# Patient Record
Sex: Male | Born: 1965 | Race: Black or African American | Hispanic: No | Marital: Married | Smoking: Never smoker
Health system: Southern US, Community
[De-identification: ages and names within clinical notes are randomized; demographics above are authoritative.]

## PROBLEM LIST (undated history)

## (undated) DIAGNOSIS — N486 Induration penis plastica: Secondary | ICD-10-CM

## (undated) DIAGNOSIS — E119 Type 2 diabetes mellitus without complications: Secondary | ICD-10-CM

## (undated) DIAGNOSIS — E785 Hyperlipidemia, unspecified: Secondary | ICD-10-CM

## (undated) DIAGNOSIS — E781 Pure hyperglyceridemia: Secondary | ICD-10-CM

## (undated) HISTORY — DX: Type 2 diabetes mellitus without complications: E11.9

## (undated) HISTORY — DX: Pure hyperglyceridemia: E78.1

## (undated) HISTORY — PX: CIRCUMCISION: SUR203

## (undated) HISTORY — PX: COLONOSCOPY: SHX174

## (undated) HISTORY — DX: Induration penis plastica: N48.6

## (undated) HISTORY — PX: POLYPECTOMY: SHX149

## (undated) HISTORY — DX: Hyperlipidemia, unspecified: E78.5

---

## 2011-06-01 ENCOUNTER — Other Ambulatory Visit: Payer: Self-pay | Admitting: Physician Assistant

## 2011-06-01 ENCOUNTER — Ambulatory Visit
Admission: RE | Admit: 2011-06-01 | Discharge: 2011-06-01 | Disposition: A | Discharge status: Home / Self Care | Payer: BC Managed Care – PPO | Source: Ambulatory Visit | Attending: Physician Assistant | Admitting: Physician Assistant

## 2011-06-01 DIAGNOSIS — M545 Low back pain: Secondary | ICD-10-CM

## 2012-07-09 ENCOUNTER — Encounter: Payer: Self-pay | Admitting: Family Medicine

## 2012-07-09 ENCOUNTER — Ambulatory Visit (INDEPENDENT_AMBULATORY_CARE_PROVIDER_SITE_OTHER): Payer: BC Managed Care – PPO | Admitting: Family Medicine

## 2012-07-09 VITALS — BP 118/72 | HR 68 | Temp 97.5°F | Resp 16 | Ht 69.0 in | Wt 170.0 lb

## 2012-07-09 DIAGNOSIS — R1903 Right lower quadrant abdominal swelling, mass and lump: Secondary | ICD-10-CM

## 2012-07-09 DIAGNOSIS — E119 Type 2 diabetes mellitus without complications: Secondary | ICD-10-CM | POA: Insufficient documentation

## 2012-07-09 NOTE — Progress Notes (Signed)
  Subjective:    Patient ID: Marvin Spencer, male    DOB: May 03, 1965, 47 y.o.   MRN: [id number]  HPI Patient is here today for a knot in his abdomen. He states approximately a week ago he felt a knot in his right lower quadrant. It is not painful. He denies any nausea vomiting diarrhea or constipation. He denies any melena, hematochezia, or change in his bowel movements. He does actively work out. He does a lot of stomach exercises.  He denies any actual cardiac he can palpate although he is concerned he may have a hernia. He denies any hematuria or dysuria. Past Medical History  Diagnosis Date  . Diabetes mellitus without complication    Current outpatient prescriptions:aspirin 81 MG tablet, Take 81 mg by mouth daily., Disp: , Rfl: ;  metFORMIN (GLUCOPHAGE) 1000 MG tablet, Take 1,000 mg by mouth 2 (two) times daily with a meal., Disp: , Rfl: ;  sitaGLIPtin (JANUVIA) 50 MG tablet, Take 50 mg by mouth daily., Disp: , Rfl:  Not on File History   Social History  . Marital Status: Single    Spouse Name: N/A    Number of Children: N/A  . Years of Education: N/A   Occupational History  . Not on file.   Social History Main Topics  . Smoking status: Never Smoker   . Smokeless tobacco: Not on file  . Alcohol Use: Not on file  . Drug Use: Not on file  . Sexually Active: Not on file   Other Topics Concern  . Not on file   Social History Narrative  . No narrative on file       Review of Systems  All other systems reviewed and are negative.       Objective:   Physical Exam  Vitals reviewed. Constitutional: He appears well-developed and well-nourished.  Cardiovascular: Normal rate, regular rhythm and normal heart sounds.  Exam reveals no gallop.   No murmur heard. Pulmonary/Chest: Effort normal and breath sounds normal. No respiratory distress. He has no wheezes. He has no rales.  Abdominal: Soft. Bowel sounds are normal. He exhibits no distension and no mass. There is no  tenderness. There is no rebound and no guarding.          Assessment & Plan:  1. Abdominal mass, RLQ (right lower quadrant) I am not able to palpate any mass today. Furthermore there is no palpable hernia. I cannot find a hernia his inguinal canal. There is no testicular mass. There is no lymphadenopathy in the right inguinal canal. His abdominal exam is benign. I think the patient may have actually strained an internal oblique muscle. I feel that this may be what he is feeling. I have recommended a tincture of time of 2-3 weeks. If symptoms resolve no further workup is necessary. If symptoms persist and is obviously a hernia which develops I would recommend a Gen. surgery consult. If symptoms persist but there continues to be no hernia on exam, I recommend CT of the abdomen and pelvis.

## 2013-09-10 ENCOUNTER — Telehealth: Payer: Self-pay | Admitting: *Deleted

## 2013-09-10 NOTE — Telephone Encounter (Signed)
Called pt in reference to referral he was stating about and he is needing a [employer] referral to endocrinologist so that they can get paid Dr. Jeanann Lewandowsky for March office visit and July office visit.I am submitting referral today and wait on authorization.

## 2013-09-10 NOTE — Telephone Encounter (Signed)
Message copied by Maureen Chatters on Thu Sep 10, 2013 12:39 PM ------      Message from: Lenore Manner      Created: Wed Sep 09, 2013 12:52 PM      Regarding: Referral      Contact: [phone]       Pt is needing to speak to you about a referral  ------

## 2013-12-31 LAB — HM DIABETES EYE EXAM

## 2014-04-13 ENCOUNTER — Telehealth: Payer: Self-pay | Admitting: *Deleted

## 2014-04-13 NOTE — Telephone Encounter (Signed)
Pt called inquiring about a referral that was place years ago and stated that Dr. Jeanann Lewandowsky endocrinologist stated that his insurance did not pay and that when he came in for his appointment today that he needed to pay the whole amt. Pt stated that he needs a referral today to Dr.Preston Carlis Abbott, but hold up on it for now d/t he is going to call the Dr.office and see if he can come in for his appt today in hopes that [employer] will pay or cancel a few weeks out and try to get it authorized thru his insurance. I did not see where pt had this insurance and asked if when he called to give me that information so that we can update in his chart, states he will be by here later today and will give me a copy of his insurance so that we can update in record.

## 2014-04-16 ENCOUNTER — Telehealth: Payer: Self-pay | Admitting: *Deleted

## 2014-04-16 NOTE — Telephone Encounter (Signed)
Pt came in office and stated that [employer] needed a referral from [place] as his PCP to submit his referral to where he went to UC at [facility] [place]  I called the above UC and asked if I can get a Diagnosis code from his visit and the doctor name and NPI that had seen him. Spoke to [place] there who took my call and stated that they submit the insurnace themselves in order for [employer] to pay and that I do not need to submit it. She would not give me the dx code nor the npi and name of doctor.

## 2014-04-16 NOTE — Telephone Encounter (Signed)
Received call back from [place] at [facility] calling me back stating that the lady that was suppose to submit the referral to [employer] never did and gave me the dx code and npi number to their office and doctor, I will submit the referral to [employer] today. NPI number Group [id number] Dr. Rulon Abide ICD 10 code Z04.9 phone number [phone]

## 2014-04-21 ENCOUNTER — Telehealth: Payer: Self-pay | Admitting: *Deleted

## 2014-04-21 NOTE — Telephone Encounter (Signed)
Received fax from [employer] ([employer]) with authorization reference number [id number]   Provider speciality: Urgent care Center ( pt went to [place])  Dx: Z04.9-encounter for examination and observation for unspecified reason  Service dates:04/11/14   Visits:1

## 2014-05-21 ENCOUNTER — Encounter: Payer: Self-pay | Admitting: Family Medicine

## 2014-05-21 ENCOUNTER — Ambulatory Visit (INDEPENDENT_AMBULATORY_CARE_PROVIDER_SITE_OTHER): Admitting: Family Medicine

## 2014-05-21 VITALS — BP 108/68 | HR 78 | Temp 97.9°F | Resp 14 | Ht 68.0 in | Wt 174.0 lb

## 2014-05-21 DIAGNOSIS — M545 Low back pain, unspecified: Secondary | ICD-10-CM

## 2014-05-21 MED ORDER — DICLOFENAC SODIUM 75 MG PO TBEC: 75.0000 mg | DELAYED_RELEASE_TABLET | Freq: Two times a day (BID) | ORAL | Status: DC

## 2014-05-21 MED ORDER — CYCLOBENZAPRINE HCL 10 MG PO TABS: 10.0000 mg | ORAL_TABLET | Freq: Three times a day (TID) | ORAL | Status: DC | PRN

## 2014-05-21 NOTE — Progress Notes (Signed)
   Subjective:    Patient ID: Marvin Spencer, male    DOB: 1965-06-12, 49 y.o.   MRN: [id number]  HPI Patients back pain developed 5 days ago. It developed gradually after exercise. Pain is located in the lumbar paraspinal muscles. There is no radiation of the pain into his legs. He denies any numbness or weakness in the legs. He denies any symptoms of sciatica. He denies any symptoms of cauda equina syndrome. He denies any fevers or chills. He denies any hematuria or dysuria. He denies any injuries to the lower back. Past Medical History  Diagnosis Date  . Diabetes mellitus without complication    No past surgical history on file. Current Outpatient Prescriptions on File Prior to Visit  Medication Sig Dispense Refill  . metFORMIN (GLUCOPHAGE) 1000 MG tablet Take 1,000 mg by mouth 2 (two) times daily with a meal.     No current facility-administered medications on file prior to visit.   No Known Allergies History   Social History  . Marital Status: Single    Spouse Name: N/A  . Number of Children: N/A  . Years of Education: N/A   Occupational History  . Not on file.   Social History Main Topics  . Smoking status: Never Smoker   . Smokeless tobacco: Not on file  . Alcohol Use: Not on file  . Drug Use: Not on file  . Sexual Activity: Not on file   Other Topics Concern  . Not on file   Social History Narrative     Review of Systems  Musculoskeletal: Positive for back pain.  All other systems reviewed and are negative.      Objective:   Physical Exam  Constitutional: He is oriented to person, place, and time.  Cardiovascular: Normal rate, regular rhythm and normal heart sounds.   No murmur heard. Pulmonary/Chest: Effort normal and breath sounds normal. No respiratory distress. He has no wheezes. He has no rales.  Musculoskeletal:       Lumbar back: He exhibits decreased range of motion, tenderness and pain. He exhibits no bony tenderness and no spasm.    Neurological: He is alert and oriented to person, place, and time. He has normal reflexes. He displays normal reflexes. No cranial nerve deficit. He exhibits normal muscle tone. Coordination normal.  Vitals reviewed.         Assessment & Plan:  Midline low back pain without sciatica - Plan: diclofenac (VOLTAREN) 75 MG EC tablet, cyclobenzaprine (FLEXERIL) 10 MG tablet  Symptoms sound consistent with a muscle strain in the lumbar paraspinal muscles. I recommended diclofenac 75 mg by mouth twice a day and Flexeril 10 mg by mouth every 8 hours when necessary pain. Recheck next week if no better. At that time I will obtain x-rays of lower back if no better. Recheck sooner if worsening

## 2014-05-28 ENCOUNTER — Other Ambulatory Visit: Payer: Self-pay | Admitting: Family Medicine

## 2014-05-28 ENCOUNTER — Other Ambulatory Visit

## 2014-05-28 DIAGNOSIS — Z79899 Other long term (current) drug therapy: Secondary | ICD-10-CM

## 2014-05-28 DIAGNOSIS — Z0000 Encounter for general adult medical examination without abnormal findings: Secondary | ICD-10-CM

## 2014-05-28 DIAGNOSIS — E119 Type 2 diabetes mellitus without complications: Secondary | ICD-10-CM

## 2014-06-01 ENCOUNTER — Telehealth: Payer: Self-pay | Admitting: *Deleted

## 2014-06-01 NOTE — Telephone Encounter (Signed)
Submitted referral to [employer] for authorization for Endocrinology Dr. Jaci Standard Clark,MD with authorization number [id number]  Dx: E11.9-Type 2 diabetes Mellitus without complications  Service codes: [phone]  Service dates: 05/27/14- 2014/09/20   Visits:1  Service codes: [phone]  Service dates: 05/27/14-09-20-14  Visits:5

## 2014-06-07 ENCOUNTER — Encounter: Payer: Self-pay | Admitting: Family Medicine

## 2014-06-07 ENCOUNTER — Ambulatory Visit (INDEPENDENT_AMBULATORY_CARE_PROVIDER_SITE_OTHER): Admitting: Family Medicine

## 2014-06-07 VITALS — BP 104/64 | HR 80 | Temp 98.0°F | Resp 16 | Ht 68.0 in | Wt 174.0 lb

## 2014-06-07 DIAGNOSIS — E781 Pure hyperglyceridemia: Secondary | ICD-10-CM | POA: Insufficient documentation

## 2014-06-07 DIAGNOSIS — Z0000 Encounter for general adult medical examination without abnormal findings: Secondary | ICD-10-CM

## 2014-06-07 DIAGNOSIS — N486 Induration penis plastica: Secondary | ICD-10-CM | POA: Insufficient documentation

## 2014-06-07 DIAGNOSIS — M545 Low back pain, unspecified: Secondary | ICD-10-CM

## 2014-06-07 NOTE — Progress Notes (Signed)
Subjective:    Patient ID: Marvin Spencer, male    DOB: 08/15/1965, 49 y.o.   MRN: [id number]  HPI Patient is a very pleasant 49 year old African-American male who is here today for physical exam. He has with him lab work that was drawn at an outside physician's office. It was significant for fasting blood sugar of 312, a triglyceride level of 419, and an HDL level of 48. His CMP was normal. His CBC was normal. His TSH was normal. His PSA was normal. His C-peptide level was normal. Patient continues to complain of midline low back pain. Please see my last office visit. The medications help with the pain but the pain has not completely resolved. Past Medical History  Diagnosis Date  . Diabetes mellitus without complication   . Peyronie disease   . Hyperlipidemia   . Hypertriglyceridemia    No past surgical history on file. Current Outpatient Prescriptions on File Prior to Visit  Medication Sig Dispense Refill  . cyclobenzaprine (FLEXERIL) 10 MG tablet Take 1 tablet (10 mg total) by mouth 3 (three) times daily as needed for muscle spasms. 30 tablet 0  . diclofenac (VOLTAREN) 75 MG EC tablet Take 1 tablet (75 mg total) by mouth 2 (two) times daily. 30 tablet 0  . glimepiride (AMARYL) 4 MG tablet Take 4 mg by mouth 2 (two) times daily.  0  . metFORMIN (GLUCOPHAGE) 1000 MG tablet Take 1,000 mg by mouth 2 (two) times daily with a meal.     No current facility-administered medications on file prior to visit.   No Known Allergies History   Social History  . Marital Status: Single    Spouse Name: N/A  . Number of Children: N/A  . Years of Education: N/A   Occupational History  . Not on file.   Social History Main Topics  . Smoking status: Never Smoker   . Smokeless tobacco: Not on file  . Alcohol Use: Not on file  . Drug Use: Not on file  . Sexual Activity: Not on file   Other Topics Concern  . Not on file   Social History Narrative   Family History  Problem Relation Age of  Onset  . Diabetes Mellitus II    . Glaucoma        Review of Systems  All other systems reviewed and are negative.      Objective:   Physical Exam  Constitutional: He is oriented to person, place, and time. He appears well-developed and well-nourished. No distress.  HENT:  Head: Normocephalic and atraumatic.  Right Ear: External ear normal.  Left Ear: External ear normal.  Nose: Nose normal.  Mouth/Throat: Oropharynx is clear and moist. No oropharyngeal exudate.  Eyes: Conjunctivae and EOM are normal. Pupils are equal, round, and reactive to light. Right eye exhibits no discharge. Left eye exhibits no discharge. No scleral icterus.  Neck: Normal range of motion. Neck supple. No JVD present. No tracheal deviation present. No thyromegaly present.  Cardiovascular: Normal rate, regular rhythm, normal heart sounds and intact distal pulses.  Exam reveals no gallop and no friction rub.   No murmur heard. Pulmonary/Chest: Effort normal and breath sounds normal. No stridor. No respiratory distress. He has no wheezes. He has no rales. He exhibits no tenderness.  Abdominal: Soft. Bowel sounds are normal. He exhibits no distension and no mass. There is no tenderness. There is no rebound and no guarding.  Genitourinary: Rectum normal and prostate normal. Circumcised.  Musculoskeletal: Normal range  of motion. He exhibits no edema or tenderness.  Lymphadenopathy:    He has no cervical adenopathy.  Neurological: He is alert and oriented to person, place, and time. He has normal reflexes. He displays normal reflexes. No cranial nerve deficit. He exhibits normal muscle tone. Coordination normal.  Skin: Skin is warm. No rash noted. He is not diaphoretic. No erythema. No pallor.  Psychiatric: He has a normal mood and affect. His behavior is normal. Judgment and thought content normal.  Vitals reviewed.         Assessment & Plan:  Midline low back pain without sciatica - Plan: DG Lumbar Spine  Complete  Routine general medical examination at a health care facility  Patient's physical exam is normal. We had a long discussion today regarding his diabetes. At present his fasting blood sugars are well controlled. He is not checking his two-hour postprandial sugars. I would like him to call me with his two-hour postprandial sugars in one month. If still elevated, I would recommend starting the patient on Invokana. Also recommended an aspirin 81 mg by mouth daily. I would like to check a urine microalbumin as well as a fasting lipid panel in 3 months. Otherwise the remainder of his physical exam is completely normal. Patient's hemoglobin A1c was 10.7. Medications were changed his endocrinologist office. We will recheck his labs in 3 months. I would recommend a pneumonia vaccine when he turns 62. Diabetic eye exam is up-to-date. Diabetic foot exam is up-to-date.

## 2014-06-15 ENCOUNTER — Ambulatory Visit
Admission: RE | Admit: 2014-06-15 | Discharge: 2014-06-15 | Disposition: A | Discharge status: Home / Self Care | Source: Ambulatory Visit | Attending: Family Medicine | Admitting: Family Medicine

## 2014-06-15 DIAGNOSIS — M545 Low back pain, unspecified: Secondary | ICD-10-CM

## 2014-06-17 ENCOUNTER — Other Ambulatory Visit: Payer: Self-pay | Admitting: Family Medicine

## 2014-06-17 DIAGNOSIS — G8929 Other chronic pain: Secondary | ICD-10-CM

## 2014-06-17 DIAGNOSIS — M545 Low back pain, unspecified: Secondary | ICD-10-CM

## 2014-07-21 ENCOUNTER — Ambulatory Visit: Admitting: Physical Therapy

## 2015-06-24 ENCOUNTER — Encounter: Payer: Self-pay | Admitting: Family Medicine

## 2015-06-24 ENCOUNTER — Ambulatory Visit (INDEPENDENT_AMBULATORY_CARE_PROVIDER_SITE_OTHER): Admitting: Family Medicine

## 2015-06-24 VITALS — BP 120/84 | HR 86 | Temp 98.2°F | Resp 14 | Ht 68.0 in | Wt 167.0 lb

## 2015-06-24 DIAGNOSIS — S39012A Strain of muscle, fascia and tendon of lower back, initial encounter: Secondary | ICD-10-CM

## 2015-06-24 DIAGNOSIS — M545 Low back pain, unspecified: Secondary | ICD-10-CM

## 2015-06-24 MED ORDER — MELOXICAM 15 MG PO TABS: 15.0000 mg | ORAL_TABLET | Freq: Every day | ORAL | Status: DC

## 2015-06-24 MED ORDER — CYCLOBENZAPRINE HCL 10 MG PO TABS: 10.0000 mg | ORAL_TABLET | Freq: Three times a day (TID) | ORAL | Status: DC | PRN

## 2015-06-24 NOTE — Progress Notes (Signed)
Subjective:    Patient ID: Marvin Spencer, male    DOB: [birth year], 50 y.o.   MRN: [id number]  HPI Patient presentsWith 2 weeks of low back pain. The pain is located in the lumbar paraspinal muscles. There is no radiation of the pain. It hurts when he bends forward and picks up heavy objects. It hurts when he gets up out of bed or when he stands up from a seated position. 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 He denies any sciatica. He denies any cauda equina syndrome. He denies any weakness or numbness in his legs. He denies fever or dysuria Past Medical History  Diagnosis Date  . Diabetes mellitus without complication ([place])   . Peyronie disease   . Hyperlipidemia   . Hypertriglyceridemia    No past surgical history on file. Current Outpatient Prescriptions on File Prior to Visit  Medication Sig Dispense Refill  . glimepiride (AMARYL) 4 MG tablet Take 4 mg by mouth 2 (two) times daily.  0  . metFORMIN (GLUCOPHAGE) 1000 MG tablet Take 1,000 mg by mouth 2 (two) times daily with a meal.    . pravastatin (PRAVACHOL) 20 MG tablet Take 20 mg by mouth daily.  0  . diclofenac (VOLTAREN) 75 MG EC tablet Take 1 tablet (75 mg total) by mouth 2 (two) times daily. (Patient not taking: Reported on 06/24/2015) 30 tablet 0  . sitaGLIPtin (JANUVIA) 100 MG tablet Take 100 mg by mouth daily. Reported on 06/24/2015     No current facility-administered medications on file prior to visit.   No Known Allergies Social History   Social History  . Marital Status: Single    Spouse Name: N/A  . Number of Children: N/A  . Years of Education: N/A   Occupational History  . Not on file.   Social History Main Topics  . Smoking status: Never Smoker   . Smokeless tobacco: Not on file  . Alcohol Use: Not on file  . Drug Use: Not on file  . Sexual Activity: Not on file   Other Topics Concern  . Not on file   Social History Narrative      Review of Systems  All other  systems reviewed and are negative.      Objective:   Physical Exam  Constitutional: He appears well-developed and well-nourished.  Cardiovascular: Normal rate, regular rhythm and normal heart sounds.   No murmur heard. Pulmonary/Chest: Effort normal and breath sounds normal. No respiratory distress. He has no wheezes. He has no rales.  Musculoskeletal:       Lumbar back: He exhibits decreased range of motion, tenderness, pain and spasm. He exhibits no bony tenderness.  Vitals reviewed.  Patient has decreased range of motion with pain with forward flexion at 45.       Assessment & Plan:  Low back strain, initial encounter - Plan: DG Lumbar Spine Complete  Midline low back pain without sciatica - Plan: cyclobenzaprine (FLEXERIL) 10 MG tablet, meloxicam (MOBIC) 15 MG tablet  I believe the patient is likely strain to lumbar paraspinal muscle. I recommended that he go for an x-ray of his lumbar spine to rule out degenerative disc disease or any other orthopedic issue. If the x-ray is normal as expected, I will treat the patient is a low back strain with meloxicam 15 mg by mouth daily and Flexeril 10 mg every 8 hours as needed. I recommended no heavy lifting more than 10 pounds for  the next 2 weeks. I am fine with the patient pushing and pulling on packages at waist level but I do not want him bending over to pick up packages. Recheck in 2 weeks if no better or sooner if worse

## 2015-07-07 ENCOUNTER — Encounter: Payer: Self-pay | Admitting: Family Medicine

## 2015-07-07 ENCOUNTER — Telehealth: Payer: Self-pay | Admitting: Family Medicine

## 2015-07-07 NOTE — Telephone Encounter (Signed)
Pt is requesting a letter from Dr. Dennard Schaumann stating that he is physically unable to participate in his [employer] physical activities this weekend due to his back injury. [phone]

## 2015-07-08 NOTE — Telephone Encounter (Signed)
To MD

## 2015-07-08 NOTE — Telephone Encounter (Signed)
Note is on RX printed on my desk

## 2015-07-08 NOTE — Telephone Encounter (Signed)
Call placed to patient and patient made aware.   Prescription faxed to [phone] per patient request.

## 2015-12-09 ENCOUNTER — Ambulatory Visit (INDEPENDENT_AMBULATORY_CARE_PROVIDER_SITE_OTHER): Admitting: Family Medicine

## 2015-12-09 ENCOUNTER — Encounter: Payer: Self-pay | Admitting: Family Medicine

## 2015-12-09 VITALS — BP 142/86 | HR 78 | Temp 97.9°F | Resp 14 | Ht 68.0 in | Wt 169.0 lb

## 2015-12-09 DIAGNOSIS — E119 Type 2 diabetes mellitus without complications: Secondary | ICD-10-CM | POA: Diagnosis not present

## 2015-12-09 LAB — COMPLETE METABOLIC PANEL WITH GFR
ALT: 30 U/L (ref 9–46)
AST: 76 U/L — AB (ref 10–35)
Albumin: 4.2 g/dL (ref 3.6–5.1)
Alkaline Phosphatase: 67 U/L (ref 40–115)
BUN: 14 mg/dL (ref 7–25)
CALCIUM: 9 mg/dL (ref 8.6–10.3)
CHLORIDE: 101 mmol/L (ref 98–110)
CO2: 27 mmol/L (ref 20–31)
Creat: 1 mg/dL (ref 0.70–1.33)
GFR, EST NON AFRICAN AMERICAN: 87 mL/min (ref >=60)
GFR, Est African American: >89 mL/min (ref >=60)
Glucose, Bld: 213 mg/dL — ABNORMAL HIGH (ref 70–99)
POTASSIUM: 3.9 mmol/L (ref 3.5–5.3)
SODIUM: 138 mmol/L (ref 135–146)
Total Bilirubin: 0.5 mg/dL (ref 0.2–1.2)
Total Protein: 6.8 g/dL (ref 6.1–8.1)

## 2015-12-09 LAB — LIPID PANEL
CHOL/HDL RATIO: 4.2 ratio (ref <=5.0)
CHOLESTEROL: 168 mg/dL (ref 125–200)
HDL: 40 mg/dL (ref >=40)
LDL Cholesterol: 93 mg/dL (ref <130)
Triglycerides: 176 mg/dL — ABNORMAL HIGH (ref <150)
VLDL: 35 mg/dL — AB (ref <30)

## 2015-12-09 LAB — CBC WITH DIFFERENTIAL/PLATELET
BASOS PCT: 0 %
Basophils Absolute: 0 cells/uL (ref 0–200)
Eosinophils Absolute: 77 cells/uL (ref 15–500)
Eosinophils Relative: 1 %
HEMATOCRIT: 43.2 % (ref 38.5–50.0)
HEMOGLOBIN: 14.3 g/dL (ref 13.0–17.0)
LYMPHS ABS: 2233 {cells}/uL (ref 850–3900)
Lymphocytes Relative: 29 %
MCH: 28.4 pg (ref 27.0–33.0)
MCHC: 33.1 g/dL (ref 32.0–36.0)
MCV: 85.7 fL (ref 80.0–100.0)
MONO ABS: 693 {cells}/uL (ref 200–950)
MPV: 9.9 fL (ref 7.5–12.5)
Monocytes Relative: 9 %
NEUTROS ABS: 4697 {cells}/uL (ref 1500–7800)
NEUTROS PCT: 61 %
Platelets: 291 10*3/uL (ref 140–400)
RBC: 5.04 MIL/uL (ref 4.20–5.80)
RDW: 12.6 % (ref 11.0–15.0)
WBC: 7.7 10*3/uL (ref 3.8–10.8)

## 2015-12-09 LAB — HEMOGLOBIN A1C
Hgb A1c MFr Bld: 11.4 % — ABNORMAL HIGH (ref <5.7)
MEAN PLASMA GLUCOSE: 280 mg/dL

## 2015-12-09 MED ORDER — GLIMEPIRIDE 4 MG PO TABS: 4.0000 mg | ORAL_TABLET | Freq: Two times a day (BID) | ORAL | 5 refills | Status: DC

## 2015-12-09 MED ORDER — METFORMIN HCL 1000 MG PO TABS: 1000.0000 mg | ORAL_TABLET | Freq: Two times a day (BID) | ORAL | 5 refills | Status: DC

## 2015-12-09 NOTE — Progress Notes (Signed)
   Subjective:    Patient ID: Marcello Moores, male    DOB: [birth year], 50 y.o.   MRN: [id number]  HPI Patient has previously been seeing an endocrinologist. However, he has not seen an endocrinologist in quite some time. He is currently on glimepiride and metformin. He is not checking his sugars but he reports polyuria, polydipsia, and blurred vision. He has been off Januvia and pravastatin for approximately 2 months. He is taking an aspirin. His blood pressure is slightly elevated. He is due for hemoglobin A1c, fasting lab work, and a urine microalbumin. He states that his fasting blood sugars in the morning of the last 2 days a been 200. Past Medical History:  Diagnosis Date  . Diabetes mellitus without complication ([place])   . Hyperlipidemia   . Hypertriglyceridemia   . Peyronie disease    No past surgical history on file. Current Outpatient Prescriptions on File Prior to Visit  Medication Sig Dispense Refill  . pravastatin (PRAVACHOL) 20 MG tablet Take 20 mg by mouth daily.  0  . sitaGLIPtin (JANUVIA) 100 MG tablet Take 100 mg by mouth daily. Reported on 06/24/2015     No current facility-administered medications on file prior to visit.    No Known Allergies Social History   Social History  . Marital status: Single    Spouse name: N/A  . Number of children: N/A  . Years of education: N/A   Occupational History  . Not on file.   Social History Main Topics  . Smoking status: Never Smoker  . Smokeless tobacco: Not on file  . Alcohol use Not on file  . Drug use: Unknown  . Sexual activity: Not on file   Other Topics Concern  . Not on file   Social History Narrative  . No narrative on file      Review of Systems  All other systems reviewed and are negative.      Objective:   Physical Exam  Neck: Neck supple. No JVD present.  Cardiovascular: Normal rate, regular rhythm and normal heart sounds.   No murmur heard. Pulmonary/Chest: Effort normal and breath sounds  normal. No respiratory distress. He has no wheezes. He has no rales.  Abdominal: Soft. Bowel sounds are normal.  Musculoskeletal: He exhibits no edema.  Vitals reviewed.         Assessment & Plan:  Diabetes mellitus type II, non insulin dependent ([facility]) - Plan: Lipid panel, COMPLETE METABOLIC PANEL WITH GFR, CBC with Differential/Platelet, Hemoglobin A1c, Microalbumin, urine  Blood pressure is elevated. I will check a urine microalbumin along with renal function. We will likely start the patient on losartan 50 mg by mouth daily depending upon lab work. I will also check a hemoglobin A1c and depending upon the level, I believe I will likely discontinue the Januvia he was previously taking and replace it with [facility] 5/25 poqday.  Check fasting lipid panel. Goal LDL cholesterol is less than 100. The patient will likely need to resume pravastatin

## 2015-12-10 LAB — MICROALBUMIN, URINE: Microalb, Ur: 2.8 mg/dL

## 2015-12-14 ENCOUNTER — Other Ambulatory Visit: Payer: Self-pay | Admitting: Family Medicine

## 2015-12-14 MED ORDER — EMPAGLIFLOZIN-LINAGLIPTIN 25-5 MG PO TABS: 1.0000 | ORAL_TABLET | Freq: Every day | ORAL | 3 refills | Status: DC

## 2015-12-16 LAB — HM DIABETES EYE EXAM

## 2015-12-22 ENCOUNTER — Encounter: Payer: Self-pay | Admitting: Family Medicine

## 2016-04-04 IMAGING — CR DG LUMBAR SPINE COMPLETE 4+V
5 series · 5 of 5 positions shown · non-contrast
Comparison: None.

CLINICAL DATA: Chronic low back pain

EXAM:
LUMBAR SPINE - COMPLETE 4+ VIEW

[w lumbar spine ap]
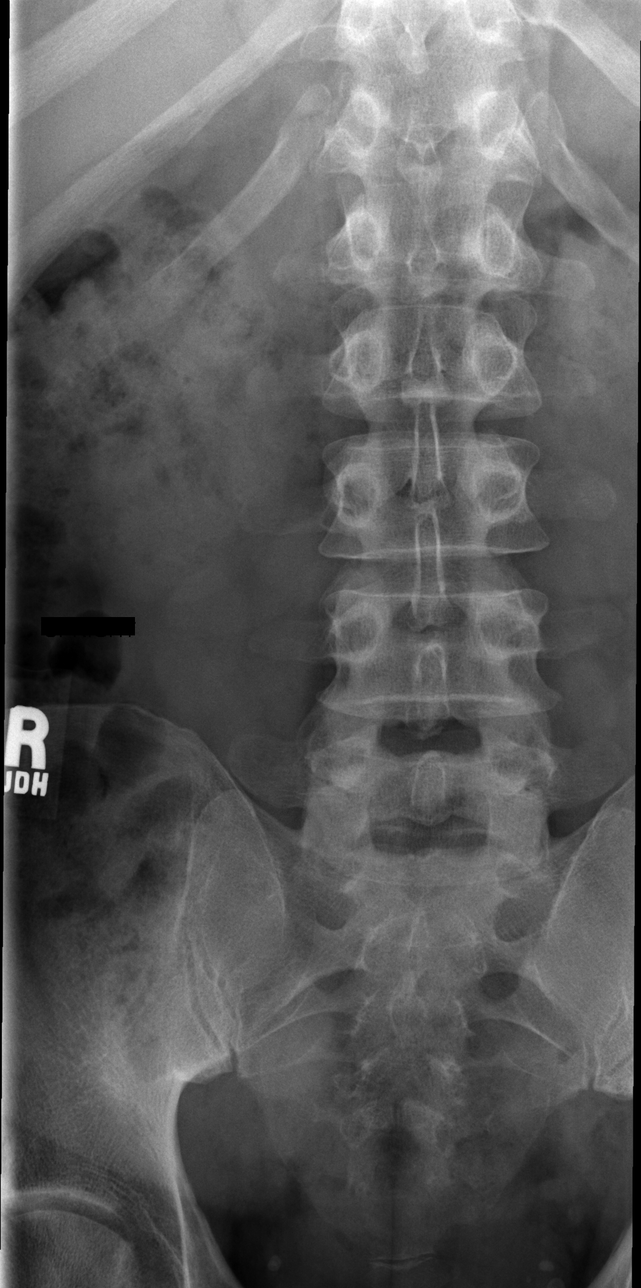

[w lumbar spine obl (1 of 2)]
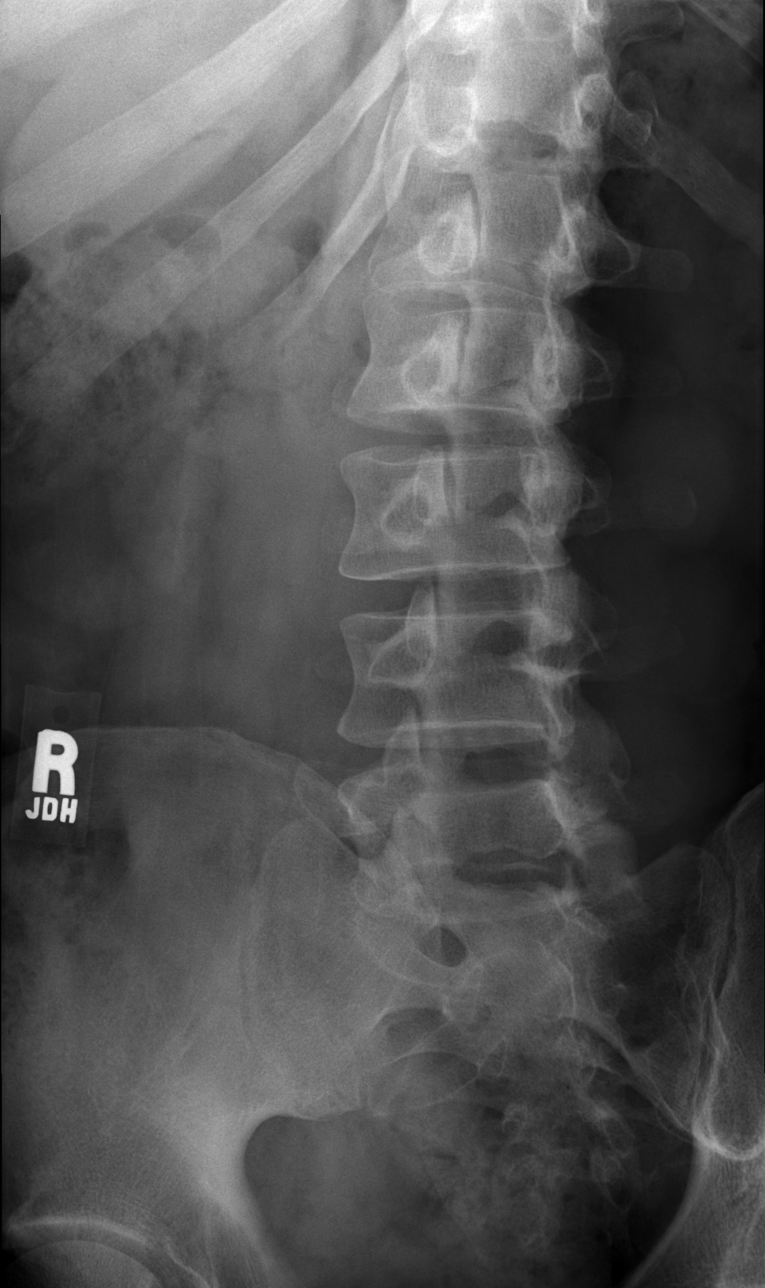

[w lumbar spine obl (2 of 2)]
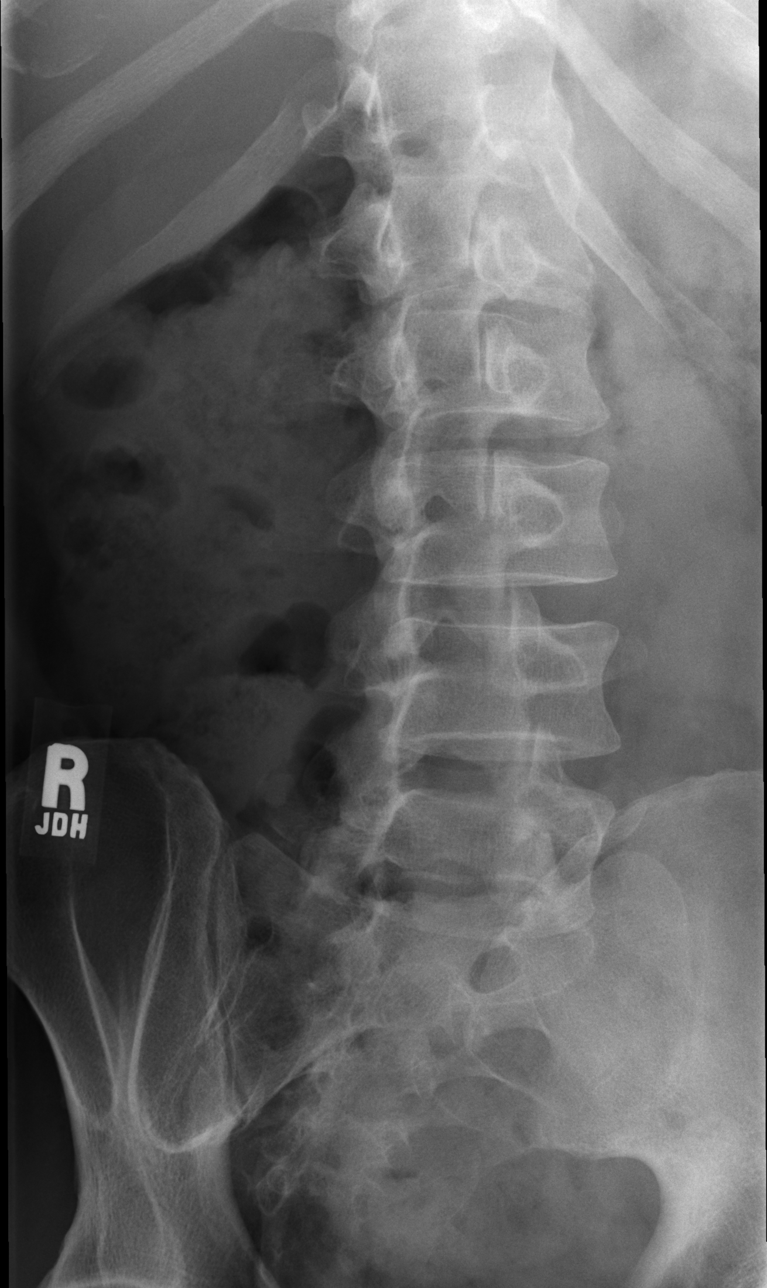

[w lumbar spine lat]
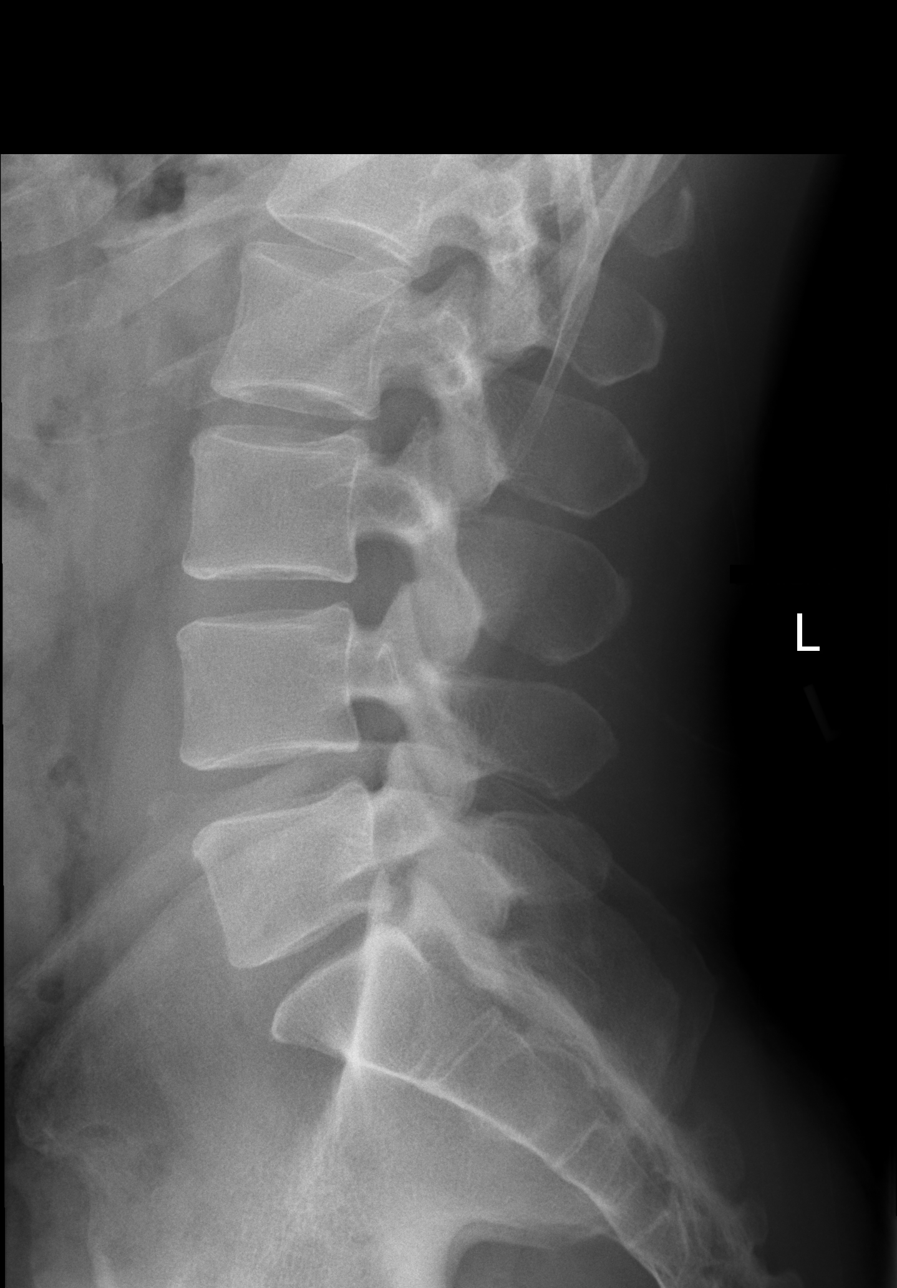

[w lumbar l-5 s-1 spot]
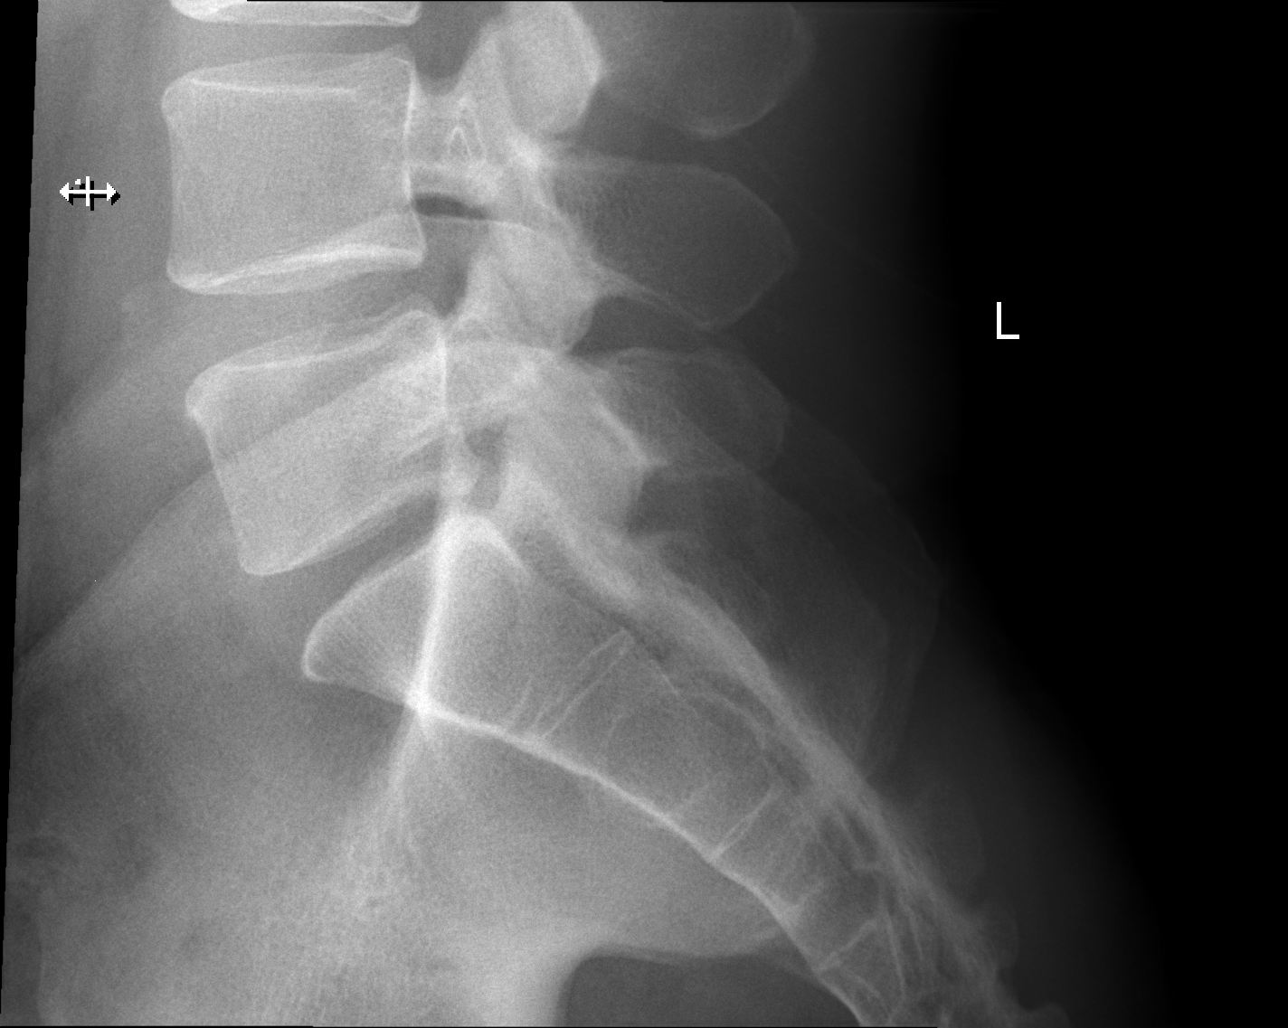

[5 of 5 positions shown; findings below may reference images not displayed]

FINDINGS: There is no evidence of lumbar spine fracture. Alignment is normal.
Intervertebral disc spaces are maintained.
IMPRESSION: Negative.

## 2016-05-30 ENCOUNTER — Encounter: Payer: Self-pay | Admitting: Family Medicine

## 2016-08-27 ENCOUNTER — Encounter: Payer: Self-pay | Admitting: Family Medicine

## 2016-09-14 ENCOUNTER — Other Ambulatory Visit: Payer: Self-pay | Admitting: Family Medicine

## 2016-09-14 MED ORDER — METFORMIN HCL 1000 MG PO TABS: 1000.0000 mg | ORAL_TABLET | Freq: Two times a day (BID) | ORAL | 0 refills | Status: DC

## 2016-09-14 MED ORDER — GLIMEPIRIDE 4 MG PO TABS: 4.0000 mg | ORAL_TABLET | Freq: Two times a day (BID) | ORAL | 0 refills | Status: DC

## 2016-09-14 NOTE — Telephone Encounter (Signed)
Refilled Glimepiride and Metformin for 30 days - pt needs ov and labs before further refill. Pharm aware.

## 2016-09-17 ENCOUNTER — Telehealth: Payer: Self-pay | Admitting: Family Medicine

## 2016-09-17 NOTE — Telephone Encounter (Signed)
Pt called several times [facility] needing a refill on Metformin and Amaryl. [facility] called this morning and said they were going to contact [facility] to get the RX from them. Per [facility] ok to do a 30 day supply but he either needs to be seen here or in [place] that this will be the last refill he will receive until seen. Pt aware of this via vm.

## 2016-09-17 NOTE — Telephone Encounter (Signed)
Clearing encounter

## 2016-09-18 MED ORDER — METFORMIN HCL 1000 MG PO TABS: 1000.0000 mg | ORAL_TABLET | Freq: Two times a day (BID) | ORAL | 0 refills | Status: DC

## 2016-09-18 MED ORDER — GLIMEPIRIDE 4 MG PO TABS: 4.0000 mg | ORAL_TABLET | Freq: Two times a day (BID) | ORAL | 0 refills | Status: DC

## 2016-09-18 NOTE — Telephone Encounter (Signed)
Pt called back [facility] and states that he will be in [place] for 21 days and then will return here and at that time will make appt. Med sent to pharm requested.

## 2016-11-27 ENCOUNTER — Encounter: Payer: Self-pay | Admitting: Family Medicine

## 2016-11-27 ENCOUNTER — Ambulatory Visit (INDEPENDENT_AMBULATORY_CARE_PROVIDER_SITE_OTHER): Admitting: Family Medicine

## 2016-11-27 VITALS — BP 124/78 | HR 79 | Temp 97.8°F | Resp 16 | Ht 68.0 in | Wt 156.0 lb

## 2016-11-27 DIAGNOSIS — E782 Mixed hyperlipidemia: Secondary | ICD-10-CM | POA: Diagnosis not present

## 2016-11-27 DIAGNOSIS — E781 Pure hyperglyceridemia: Secondary | ICD-10-CM

## 2016-11-27 DIAGNOSIS — R634 Abnormal weight loss: Secondary | ICD-10-CM

## 2016-11-27 DIAGNOSIS — Z125 Encounter for screening for malignant neoplasm of prostate: Secondary | ICD-10-CM

## 2016-11-27 DIAGNOSIS — E119 Type 2 diabetes mellitus without complications: Secondary | ICD-10-CM

## 2016-11-27 DIAGNOSIS — Z23 Encounter for immunization: Secondary | ICD-10-CM | POA: Diagnosis not present

## 2016-11-27 NOTE — Progress Notes (Signed)
Subjective:    Patient ID: Marvin Spencer, male    DOB: June 10, 1965, 51 y.o.   MRN: [id number]  HPI  11/2015 Patient has previously been seeing an endocrinologist. However, he has not seen an endocrinologist in quite some time. He is currently on glimepiride and metformin. He is not checking his sugars but he reports polyuria, polydipsia, and blurred vision. He has been off Januvia and pravastatin for approximately 2 months. He is taking an aspirin. His blood pressure is slightly elevated. He is due for hemoglobin A1c, fasting lab work, and a urine microalbumin. He states that his fasting blood sugars in the morning of the last 2 days a been 200.  At that time, my plan was: Blood pressure is elevated. I will check a urine microalbumin along with renal function. We will likely start the patient on losartan 50 mg by mouth daily depending upon lab work. I will also check a hemoglobin A1c and depending upon the level, I believe I will likely discontinue the Januvia he was previously taking and replace it with [facility] 5/25 poqday.  Check fasting lipid panel. Goal LDL cholesterol is less than 100. The patient will likely need to resume pravastatin  11/27/2016 Patient has not been seen in 1 year or had lab work.  He did have a diabetic eye exam in 12/2015.  He reports that he is not checking his blood sugar.  When he does, his blood sugar is greater then 300. He does report polyuria, polydipsia area and he denies blurry vision. He does demonstrate weight loss since his last visit despite eating a regular diet. His complexion is paler than before and he does look dehydrated.  He denies any chest pain shortness of breath dyspnea on exertion. He has not taken any medication in more than a month. Prior to that he was only on Amaryl and metformin. He is due for prostate cancer screening as well as a colonoscopy but he defers a colonoscopy at the present time  Past Medical History:  Diagnosis Date  . Diabetes  mellitus without complication ([place])   . Hyperlipidemia   . Hypertriglyceridemia   . Peyronie disease    No past surgical history on file. Current Outpatient Prescriptions on File Prior to Visit  Medication Sig Dispense Refill  . Empagliflozin-Linagliptin (GLYXAMBI) 25-5 MG TABS Take 1 tablet by mouth daily. 30 tablet 3  . glimepiride (AMARYL) 4 MG tablet Take 1 tablet (4 mg total) by mouth 2 (two) times daily. 60 tablet 0  . metFORMIN (GLUCOPHAGE) 1000 MG tablet Take 1 tablet (1,000 mg total) by mouth 2 (two) times daily with a meal. 60 tablet 0  . pravastatin (PRAVACHOL) 20 MG tablet Take 20 mg by mouth daily.  0   No current facility-administered medications on file prior to visit.    No Known Allergies Social History   Social History  . Marital status: Single    Spouse name: N/A  . Number of children: N/A  . Years of education: N/A   Occupational History  . Not on file.   Social History Main Topics  . Smoking status: Never Smoker  . Smokeless tobacco: Not on file  . Alcohol use Not on file  . Drug use: Unknown  . Sexual activity: Not on file   Other Topics Concern  . Not on file   Social History Narrative  . No narrative on file      Review of Systems  All other systems reviewed and are  negative.      Objective:   Physical Exam  Neck: Neck supple. No JVD present.  Cardiovascular: Normal rate, regular rhythm and normal heart sounds.   No murmur heard. Pulmonary/Chest: Effort normal and breath sounds normal. No respiratory distress. He has no wheezes. He has no rales.  Abdominal: Soft. Bowel sounds are normal.  Musculoskeletal: He exhibits no edema.  Vitals reviewed.         Assessment & Plan:  Diabetes mellitus type II, non insulin dependent ([place]) - Plan: CBC with Differential/Platelet, COMPLETE METABOLIC PANEL WITH GFR, Lipid panel, Microalbumin, urine, Hemoglobin A1c  Hypertriglyceridemia - Plan: COMPLETE METABOLIC PANEL WITH GFR, Lipid  panel  Mixed hyperlipidemia - Plan: COMPLETE METABOLIC PANEL WITH GFR, Lipid panel  Blood pressure is acceptable today. However I anticipate his hemoglobin A1c will be greater than 13. If greater than 13, I will start the patient on insulin rather than add oral medication. I suspect that he will require Lantus. Await the results of lab work. I will also check a fasting lipid panel. Goal LDL cholesterol is less than 100. Patient received his pneumonia vaccine today. He is already received his flu shot. Diabetic eye exam is up-to-date and diabetic foot exam was performed today and is normal. Given his weight loss, I will screen the patient for prostate cancer with a PSA and I will also check a TSH that is due secondary to his diabetes. He declines a colonoscopy at the present time

## 2016-11-28 LAB — MICROALBUMIN, URINE: MICROALB UR: 6.3 mg/dL

## 2016-11-28 LAB — COMPLETE METABOLIC PANEL WITH GFR
AG Ratio: 1.4 (calc) (ref 1.0–2.5)
ALBUMIN MSPROF: 4 g/dL (ref 3.6–5.1)
ALKALINE PHOSPHATASE (APISO): 100 U/L (ref 40–115)
ALT: 26 U/L (ref 9–46)
AST: 27 U/L (ref 10–35)
BUN: 13 mg/dL (ref 7–25)
CALCIUM: 9.2 mg/dL (ref 8.6–10.3)
CO2: 26 mmol/L (ref 20–32)
CREATININE: 0.93 mg/dL (ref 0.70–1.33)
Chloride: 98 mmol/L (ref 98–110)
GFR, EST NON AFRICAN AMERICAN: 95 mL/min/{1.73_m2} (ref >=60)
GFR, Est African American: 110 mL/min/{1.73_m2} (ref >=60)
GLOBULIN: 2.8 g/dL (ref 1.9–3.7)
GLUCOSE: 348 mg/dL — AB (ref 65–99)
Potassium: 3.9 mmol/L (ref 3.5–5.3)
SODIUM: 135 mmol/L (ref 135–146)
TOTAL PROTEIN: 6.8 g/dL (ref 6.1–8.1)
Total Bilirubin: 0.6 mg/dL (ref 0.2–1.2)

## 2016-11-28 LAB — LIPID PANEL
CHOLESTEROL: 293 mg/dL — AB (ref <200)
HDL: 47 mg/dL (ref >40)
Non-HDL Cholesterol (Calc): 246 mg/dL (calc) — ABNORMAL HIGH (ref <130)
Total CHOL/HDL Ratio: 6.2 (calc) — ABNORMAL HIGH (ref <5.0)
Triglycerides: 850 mg/dL — ABNORMAL HIGH (ref <150)

## 2016-11-28 LAB — CBC WITH DIFFERENTIAL/PLATELET
BASOS ABS: 32 {cells}/uL (ref 0–200)
BASOS PCT: 0.5 %
EOS PCT: 1.1 %
Eosinophils Absolute: 69 cells/uL (ref 15–500)
HEMATOCRIT: 45.3 % (ref 38.5–50.0)
HEMOGLOBIN: 14.9 g/dL (ref 13.2–17.1)
LYMPHS ABS: 2029 {cells}/uL (ref 850–3900)
MCH: 27.8 pg (ref 27.0–33.0)
MCHC: 32.9 g/dL (ref 32.0–36.0)
MCV: 84.5 fL (ref 80.0–100.0)
MONOS PCT: 7.7 %
MPV: 10.7 fL (ref 7.5–12.5)
NEUTROS ABS: 3686 {cells}/uL (ref 1500–7800)
Neutrophils Relative %: 58.5 %
Platelets: 278 10*3/uL (ref 140–400)
RBC: 5.36 10*6/uL (ref 4.20–5.80)
RDW: 12 % (ref 11.0–15.0)
Total Lymphocyte: 32.2 %
WBC mixed population: 485 cells/uL (ref 200–950)
WBC: 6.3 10*3/uL (ref 3.8–10.8)

## 2016-11-28 LAB — TSH: TSH: 1.08 mIU/L (ref 0.40–4.50)

## 2016-11-28 LAB — HEMOGLOBIN A1C
EAG (MMOL/L): 19.4 (calc)
HEMOGLOBIN A1C: 13.8 %{Hb} — AB (ref <5.7)
MEAN PLASMA GLUCOSE: 349 (calc)

## 2016-11-28 LAB — PSA: PSA: 0.6 ng/mL (ref <=4.0)

## 2016-11-29 ENCOUNTER — Other Ambulatory Visit: Payer: Self-pay | Admitting: Family Medicine

## 2016-11-29 MED ORDER — FENOFIBRATE 160 MG PO TABS: 160.0000 mg | ORAL_TABLET | Freq: Every day | ORAL | 1 refills | Status: DC

## 2016-11-29 MED ORDER — BASAGLAR KWIKPEN 100 UNIT/ML ~~LOC~~ SOPN: 10.0000 [IU] | PEN_INJECTOR | Freq: Every day | SUBCUTANEOUS | 2 refills | Status: DC

## 2016-11-29 MED ORDER — INSULIN PEN NEEDLE 32G X 4 MM MISC: 5 refills | Status: DC

## 2016-12-04 ENCOUNTER — Telehealth: Payer: Self-pay | Admitting: Family Medicine

## 2016-12-04 DIAGNOSIS — E7800 Pure hypercholesterolemia, unspecified: Secondary | ICD-10-CM

## 2016-12-04 DIAGNOSIS — R7309 Other abnormal glucose: Secondary | ICD-10-CM

## 2016-12-04 NOTE — Telephone Encounter (Signed)
Patient is calling to ask questions about his diabetes medcation, he is out of meds, and has been out of meds  Also needs to switch to pill form if possible and not insulin   [phone]

## 2016-12-04 NOTE — Telephone Encounter (Signed)
See note below and advise. 

## 2016-12-05 MED ORDER — EMPAGLIFLOZIN-LINAGLIPTIN 25-5 MG PO TABS: 1.0000 | ORAL_TABLET | Freq: Every day | ORAL | 3 refills | Status: DC

## 2016-12-05 MED ORDER — METFORMIN HCL 1000 MG PO TABS: 1000.0000 mg | ORAL_TABLET | Freq: Two times a day (BID) | ORAL | 3 refills | Status: DC

## 2016-12-05 MED ORDER — GLIMEPIRIDE 4 MG PO TABS: 4.0000 mg | ORAL_TABLET | Freq: Two times a day (BID) | ORAL | 3 refills | Status: DC

## 2016-12-05 NOTE — Telephone Encounter (Signed)
I am fine refilling glyxambi, glipizide, and metformin but his sugars are out of control and he needs insulin because his sugars were very bad even on the pills. I want to help him but I feel he requires insulin.

## 2016-12-05 NOTE — Telephone Encounter (Signed)
Spoke with patient he is aware of provider recommendations and states he was off of his medications for about 2 months which is why his numbers are so high. Patient is aware of the refills and to follow up in about 3 months for lab repeat per [employer].

## 2017-03-07 ENCOUNTER — Other Ambulatory Visit: Payer: Self-pay

## 2017-04-01 ENCOUNTER — Encounter: Payer: Self-pay | Admitting: Family Medicine

## 2017-04-15 ENCOUNTER — Other Ambulatory Visit: Payer: Self-pay

## 2017-04-18 ENCOUNTER — Encounter: Payer: Self-pay | Admitting: Gastroenterology

## 2017-05-13 ENCOUNTER — Encounter: Payer: Self-pay | Admitting: Family Medicine

## 2017-06-04 ENCOUNTER — Other Ambulatory Visit: Payer: Self-pay

## 2017-06-04 ENCOUNTER — Ambulatory Visit (AMBULATORY_SURGERY_CENTER): Payer: Self-pay | Admitting: *Deleted

## 2017-06-04 VITALS — Ht 68.0 in | Wt 163.4 lb

## 2017-06-04 DIAGNOSIS — Z1211 Encounter for screening for malignant neoplasm of colon: Secondary | ICD-10-CM

## 2017-06-04 NOTE — Progress Notes (Addendum)
No egg or soy allergy known to patient  No issues with past sedation with any surgeries  or procedures, no intubation problems  No diet pills per patient No home 02 use per patient  No blood thinners per patient  Pt denies issues with constipation  No A fib or A flutter  EMMI video sent to pt's e mail    Plenvu Sample given to patient  Lot [id number]  Exp  07/2018  1 kit

## 2017-06-17 ENCOUNTER — Ambulatory Visit (AMBULATORY_SURGERY_CENTER): Admitting: Gastroenterology

## 2017-06-17 ENCOUNTER — Encounter: Payer: Self-pay | Admitting: Gastroenterology

## 2017-06-17 ENCOUNTER — Other Ambulatory Visit: Payer: Self-pay

## 2017-06-17 VITALS — BP 107/68 | HR 66 | Temp 97.7°F | Resp 15 | Ht 68.0 in | Wt 163.0 lb

## 2017-06-17 DIAGNOSIS — Z1211 Encounter for screening for malignant neoplasm of colon: Secondary | ICD-10-CM | POA: Diagnosis present

## 2017-06-17 DIAGNOSIS — D120 Benign neoplasm of cecum: Secondary | ICD-10-CM

## 2017-06-17 DIAGNOSIS — D123 Benign neoplasm of transverse colon: Secondary | ICD-10-CM

## 2017-06-17 MED ORDER — SODIUM CHLORIDE 0.9 % IV SOLN: 500.0000 mL | Freq: Once | INTRAVENOUS | Status: DC

## 2017-06-17 NOTE — Patient Instructions (Signed)
Impression/Recommendations:  Polyp handout given to patient. Diverticulosis handout given to patient. Hemorrhoid handout given to patient.  Resume previous diet. Continue present medications.  Repeat colonoscopy in 3-5 years for surveillance based on pathology results.  YOU HAD AN ENDOSCOPIC PROCEDURE TODAY AT [facility]:   Refer to the procedure report that was given to you for any specific questions about what was found during the examination.  If the procedure report does not answer your questions, please call your gastroenterologist to clarify.  If you requested that your care partner not be given the details of your procedure findings, then the procedure report has been included in a sealed envelope for you to review at your convenience later.  YOU SHOULD EXPECT: Some feelings of bloating in the abdomen. Passage of more gas than usual.  Walking can help get rid of the air that was put into your GI tract during the procedure and reduce the bloating. If you had a lower endoscopy (such as a colonoscopy or flexible sigmoidoscopy) you may notice spotting of blood in your stool or on the toilet paper. If you underwent a bowel prep for your procedure, you may not have a normal bowel movement for a few days.  Please Note:  You might notice some irritation and congestion in your nose or some drainage.  This is from the oxygen used during your procedure.  There is no need for concern and it should clear up in a day or so.  SYMPTOMS TO REPORT IMMEDIATELY:   Following lower endoscopy (colonoscopy or flexible sigmoidoscopy):  Excessive amounts of blood in the stool  Significant tenderness or worsening of abdominal pains  Swelling of the abdomen that is new, acute  Fever of 100F or higher  For urgent or emergent issues, a gastroenterologist can be reached at any hour by calling [phone].   DIET:  We do recommend a small meal at first, but then you may proceed to your  regular diet.  Drink plenty of fluids but you should avoid alcoholic beverages for 24 hours.  ACTIVITY:  You should plan to take it easy for the rest of today and you should NOT DRIVE or use heavy machinery until tomorrow (because of the sedation medicines used during the test).    FOLLOW UP: Our staff will call the number listed on your records the next business day following your procedure to check on you and address any questions or concerns that you may have regarding the information given to you following your procedure. If we do not reach you, we will leave a message.  However, if you are feeling well and you are not experiencing any problems, there is no need to return our call.  We will assume that you have returned to your regular daily activities without incident.  If any biopsies were taken you will be contacted by phone or by letter within the next 1-3 weeks.  Please call us at [phone] if you have not heard about the biopsies in 3 weeks.    SIGNATURES/CONFIDENTIALITY: You and/or your care partner have signed paperwork which will be entered into your electronic medical record.  These signatures attest to the fact that that the information above on your After Visit Summary has been reviewed and is understood.  Full responsibility of the confidentiality of this discharge information lies with you and/or your care-partner.

## 2017-06-17 NOTE — Progress Notes (Signed)
Pt's states no medical or surgical changes since previsit or office visit. 

## 2017-06-17 NOTE — Progress Notes (Signed)
Called to room to assist during endoscopic procedure.  Patient ID and intended procedure confirmed with present staff. Received instructions for my participation in the procedure from the performing physician.  

## 2017-06-17 NOTE — Op Note (Signed)
[facility] Patient Name: Marvin Spencer Procedure Date: 06/17/2017 2:45 PM MRN: [id number] Endoscopist: Mauri Pole , MD Age: 52 Referring MD:  Date of Birth: 17-Dec-1965 Gender: Male Account #: [id number] Procedure:                Colonoscopy Indications:              Screening for colorectal malignant neoplasm Medicines:                Monitored Anesthesia Care Procedure:                Pre-Anesthesia Assessment:                           - Prior to the procedure, a History and Physical                            was performed, and patient medications and                            allergies were reviewed. The patient's tolerance of                            previous anesthesia was also reviewed. The risks                            and benefits of the procedure and the sedation                            options and risks were discussed with the patient.                            All questions were answered, and informed consent                            was obtained. Prior Anticoagulants: The patient has                            taken no previous anticoagulant or antiplatelet                            agents. ASA Grade Assessment: II - A patient with                            mild systemic disease. After reviewing the risks                            and benefits, the patient was deemed in                            satisfactory condition to undergo the procedure.                           After obtaining informed consent, the colonoscope  was passed under direct vision. Throughout the                            procedure, the patient's blood pressure, pulse, and                            oxygen saturations were monitored continuously. The                            Colonoscope was introduced through the anus and                            advanced to the the cecum, identified by                            appendiceal orifice and  ileocecal valve. The                            colonoscopy was performed without difficulty. The                            patient tolerated the procedure well. The quality                            of the bowel preparation was excellent. The                            ileocecal valve, appendiceal orifice, and rectum                            were photographed. Scope In: 2:52:01 PM Scope Out: 3:07:33 PM Scope Withdrawal Time: 0 hours 10 minutes 14 seconds  Total Procedure Duration: 0 hours 15 minutes 32 seconds  Findings:                 The perianal and digital rectal examinations were                            normal.                           Three sessile polyps were found in the transverse                            colon and cecum. The polyps were 4 to 6 mm in size.                            These polyps were removed with a cold snare.                            Resection and retrieval were complete.                           A 1 mm polyp was found in the transverse colon. The  polyp was sessile. The polyp was removed with a                            cold biopsy forceps. Resection and retrieval were                            complete.                           Scattered small-mouthed diverticula were found in                            the sigmoid colon and descending colon.                           Non-bleeding internal hemorrhoids were found during                            retroflexion. The hemorrhoids were small. Complications:            No immediate complications. Estimated Blood Loss:     Estimated blood loss was minimal. Impression:               - Three 4 to 6 mm polyps in the transverse colon                            and in the cecum, removed with a cold snare.                            Resected and retrieved.                           - One 1 mm polyp in the transverse colon, removed                            with a cold biopsy  forceps. Resected and retrieved.                           - Mild diverticulosis in the sigmoid colon and in                            the descending colon.                           - Non-bleeding internal hemorrhoids. Recommendation:           - Patient has a contact number available for                            emergencies. The signs and symptoms of potential                            delayed complications were discussed with the                            patient.  Return to normal activities tomorrow.                            Written discharge instructions were provided to the                            patient.                           - Resume previous diet.                           - Continue present medications.                           - Await pathology results.                           - Repeat colonoscopy in 3 - 5 years for                            surveillance based on pathology results. Mauri Pole, MD 06/17/2017 3:14:12 PM This report has been signed electronically.

## 2017-06-17 NOTE — Progress Notes (Signed)
Dr. Silverio Decamp notifed of pts BBG of 301, no orders given.

## 2017-06-17 NOTE — Progress Notes (Signed)
Report given to PACU, vss 

## 2017-06-18 ENCOUNTER — Telehealth: Payer: Self-pay | Admitting: *Deleted

## 2017-06-18 NOTE — Telephone Encounter (Signed)
  Follow up Call-  Call back number 06/17/2017  Post procedure Call Back phone  # [phone]  Permission to leave phone message Yes  Some recent data might be hidden     Patient questions:  Do you have a fever, pain , or abdominal swelling? No. Pain Score  0 *  Have you tolerated food without any problems? Yes.    Have you been able to return to your normal activities? Yes.    Do you have any questions about your discharge instructions: Diet   No. Medications  No. Follow up visit  No.  Do you have questions or concerns about your Care? No.  Actions: * If pain score is 4 or above: No action needed, pain <4.

## 2017-06-25 ENCOUNTER — Encounter: Payer: Self-pay | Admitting: Gastroenterology

## 2017-11-07 ENCOUNTER — Ambulatory Visit (INDEPENDENT_AMBULATORY_CARE_PROVIDER_SITE_OTHER): Admitting: Family Medicine

## 2017-11-07 ENCOUNTER — Encounter: Payer: Self-pay | Admitting: Family Medicine

## 2017-11-07 VITALS — BP 112/82 | HR 64 | Temp 97.7°F | Resp 14 | Ht 68.0 in | Wt 153.0 lb

## 2017-11-07 DIAGNOSIS — Z125 Encounter for screening for malignant neoplasm of prostate: Secondary | ICD-10-CM

## 2017-11-07 DIAGNOSIS — Z23 Encounter for immunization: Secondary | ICD-10-CM

## 2017-11-07 DIAGNOSIS — E781 Pure hyperglyceridemia: Secondary | ICD-10-CM

## 2017-11-07 DIAGNOSIS — E119 Type 2 diabetes mellitus without complications: Secondary | ICD-10-CM

## 2017-11-07 DIAGNOSIS — E782 Mixed hyperlipidemia: Secondary | ICD-10-CM | POA: Diagnosis not present

## 2017-11-07 MED ORDER — METFORMIN HCL 1000 MG PO TABS: 1000.0000 mg | ORAL_TABLET | Freq: Two times a day (BID) | ORAL | 3 refills | Status: AC

## 2017-11-07 MED ORDER — GLIMEPIRIDE 4 MG PO TABS: 4.0000 mg | ORAL_TABLET | Freq: Two times a day (BID) | ORAL | 3 refills | Status: AC

## 2017-11-07 NOTE — Progress Notes (Signed)
Subjective:    Patient ID: Marvin Spencer, male    DOB: [birth year], 52 y.o.   MRN: [id number]  HPI  11/2015 Patient has previously been seeing an endocrinologist. However, he has not seen an endocrinologist in quite some time. He is currently on glimepiride and metformin. He is not checking his sugars but he reports polyuria, polydipsia, and blurred vision. He has been off Januvia and pravastatin for approximately 2 months. He is taking an aspirin. His blood pressure is slightly elevated. He is due for hemoglobin A1c, fasting lab work, and a urine microalbumin. He states that his fasting blood sugars in the morning of the last 2 days a been 200.  At that time, my plan was: Blood pressure is elevated. I will check a urine microalbumin along with renal function. We will likely start the patient on losartan 50 mg by mouth daily depending upon lab work. I will also check a hemoglobin A1c and depending upon the level, I believe I will likely discontinue the Januvia he was previously taking and replace it with [employer] 5/25 poqday.  Check fasting lipid panel. Goal LDL cholesterol is less than 100. The patient will likely need to resume pravastatin  11/27/2016 Patient has not been seen in 1 year or had lab work.  He did have a diabetic eye exam in 12/2015.  He reports that he is not checking his blood sugar.  When he does, his blood sugar is greater then 300. He does report polyuria, polydipsia area and he denies blurry vision. He does demonstrate weight loss since his last visit despite eating a regular diet. His complexion is paler than before and he does look dehydrated.  He denies any chest pain shortness of breath dyspnea on exertion. He has not taken any medication in more than a month. Prior to that he was only on Amaryl and metformin. He is due for prostate cancer screening as well as a colonoscopy but he defers a colonoscopy at the present time.  At that time, my plan was: Blood pressure is  acceptable today. However I anticipate his hemoglobin A1c will be greater than 13. If greater than 13, I will start the patient on insulin rather than add oral medication. I suspect that he will require Lantus. Await the results of lab work. I will also check a fasting lipid panel. Goal LDL cholesterol is less than 100. Patient received his pneumonia vaccine today. He is already received his flu shot. Diabetic eye exam is up-to-date and diabetic foot exam was performed today and is normal. Given his weight loss, I will screen the patient for prostate cancer with a PSA and I will also check a TSH that is due secondary to his diabetes. He declines a colonoscopy at the present time  11/07/17 Patient's hemoglobin A1c a year ago was 13.8.  He refused to try insulin.  At that time we started the patient on Amaryl and metformin due to her variety of issues.  He then did not follow-up as planned.  He was lost to follow-up for more than a year.  Eventually we had to refused to refill his medication in order for him to come back to be seen.  He is here today for follow-up.  He has been off metformin and Amaryl for more than a month.  He reports polyuria.  He reports blurry vision.  His weight is down 3 pounds from his last visit.  He denies chest pain shortness of breath or dyspnea on  exertion.  He denies any neuropathy in his feet.  He denies any myalgias or right upper quadrant pain although he has not been taking his statin either  Past Medical History:  Diagnosis Date  . Diabetes mellitus without complication ([place])   . Hyperlipidemia   . Hypertriglyceridemia   . Peyronie disease    Past Surgical History:  Procedure Laterality Date  . CIRCUMCISION     age 15   Current Outpatient Medications on File Prior to Visit  Medication Sig Dispense Refill  . aspirin EC 81 MG tablet Take 81 mg by mouth daily.    . pravastatin (PRAVACHOL) 20 MG tablet Take 20 mg by mouth daily.  0   Current Facility-Administered  Medications on File Prior to Visit  Medication Dose Route Frequency Provider Last Rate Last Dose  . 0.9 %  sodium chloride infusion  500 mL Intravenous Once Nandigam, Venia Minks, MD       No Known Allergies Social History   Socioeconomic History  . Marital status: Single    Spouse name: Not on file  . Number of children: Not on file  . Years of education: Not on file  . Highest education level: Not on file  Occupational History  . Not on file  Social Needs  . Financial resource strain: Not on file  . Food insecurity:    Worry: Not on file    Inability: Not on file  . Transportation needs:    Medical: Not on file    Non-medical: Not on file  Tobacco Use  . Smoking status: Never Smoker  . Smokeless tobacco: Never Used  Substance and Sexual Activity  . Alcohol use: Not Currently  . Drug use: Not on file  . Sexual activity: Not on file  Lifestyle  . Physical activity:    Days per week: Not on file    Minutes per session: Not on file  . Stress: Not on file  Relationships  . Social connections:    Talks on phone: Not on file    Gets together: Not on file    Attends religious service: Not on file    Active member of club or organization: Not on file    Attends meetings of clubs or organizations: Not on file    Relationship status: Not on file  . Intimate partner violence:    Fear of current or ex partner: Not on file    Emotionally abused: Not on file    Physically abused: Not on file    Forced sexual activity: Not on file  Other Topics Concern  . Not on file  Social History Narrative  . Not on file      Review of Systems  All other systems reviewed and are negative.      Objective:   Physical Exam  Neck: Neck supple. No JVD present.  Cardiovascular: Normal rate, regular rhythm and normal heart sounds.  No murmur heard. Pulmonary/Chest: Effort normal and breath sounds normal. No respiratory distress. He has no wheezes. He has no rales.  Abdominal: Soft.  Bowel sounds are normal.  Musculoskeletal: He exhibits no edema.  Vitals reviewed.         Assessment & Plan:  Diabetes mellitus type II, non insulin dependent ([facility]) - Plan: Hemoglobin A1c, COMPLETE METABOLIC PANEL WITH GFR, CBC with Differential/Platelet, Microalbumin, urine  Hypertriglyceridemia  Mixed hyperlipidemia  Prostate cancer screening - Plan: PSA  Need for prophylactic vaccination and inoculation against influenza - Plan: Flu Vaccine QUAD  36+ mos IM I spent more than 30 minutes today with the patient explaining the risk of uncontrolled diabetes including blindness, heart attacks, strokes, kidney failure, peripheral vascular disease, and amputation.  I explained to him that this is likely going to happen soon if he does not begin to make an effort in managing his disease.  I also told the patient that I will be glad to work with him but that I need him to meet me halfway.  At the present time he is not actively managing his disease.  Therefore I will check baseline lab work to determine where we stand.  I did give the patient a prescription for Amaryl today that he can begin into the lab work comes back however if his hemoglobin A1c is greater than 13 I will insist on him using insulin or I will insist on him seeing an endocrinologist.  I will also check a fasting lipid panel and a urine microalbumin and while checking lab work I will check a PSA.  His blood pressure is controlled.  He did receive his flu shot

## 2017-11-08 LAB — COMPLETE METABOLIC PANEL WITH GFR
AG RATIO: 1.6 (calc) (ref 1.0–2.5)
ALBUMIN MSPROF: 4.4 g/dL (ref 3.6–5.1)
ALT: 15 U/L (ref 9–46)
AST: 14 U/L (ref 10–35)
Alkaline phosphatase (APISO): 95 U/L (ref 40–115)
BUN: 18 mg/dL (ref 7–25)
CHLORIDE: 100 mmol/L (ref 98–110)
CO2: 27 mmol/L (ref 20–32)
Calcium: 9.1 mg/dL (ref 8.6–10.3)
Creat: 0.89 mg/dL (ref 0.70–1.33)
GFR, EST AFRICAN AMERICAN: 115 mL/min/{1.73_m2} (ref >=60)
GFR, Est Non African American: 99 mL/min/{1.73_m2} (ref >=60)
GLOBULIN: 2.7 g/dL (ref 1.9–3.7)
Glucose, Bld: 373 mg/dL — ABNORMAL HIGH (ref 65–99)
POTASSIUM: 4.2 mmol/L (ref 3.5–5.3)
SODIUM: 135 mmol/L (ref 135–146)
TOTAL PROTEIN: 7.1 g/dL (ref 6.1–8.1)
Total Bilirubin: 0.6 mg/dL (ref 0.2–1.2)

## 2017-11-08 LAB — CBC WITH DIFFERENTIAL/PLATELET
BASOS ABS: 32 {cells}/uL (ref 0–200)
Basophils Relative: 0.6 %
EOS ABS: 49 {cells}/uL (ref 15–500)
EOS PCT: 0.9 %
HCT: 45.8 % (ref 38.5–50.0)
Hemoglobin: 15 g/dL (ref 13.2–17.1)
Lymphs Abs: 1723 cells/uL (ref 850–3900)
MCH: 28.1 pg (ref 27.0–33.0)
MCHC: 32.8 g/dL (ref 32.0–36.0)
MCV: 85.9 fL (ref 80.0–100.0)
MONOS PCT: 9.4 %
MPV: 10.9 fL (ref 7.5–12.5)
NEUTROS PCT: 57.2 %
Neutro Abs: 3089 cells/uL (ref 1500–7800)
PLATELETS: 271 10*3/uL (ref 140–400)
RBC: 5.33 10*6/uL (ref 4.20–5.80)
RDW: 12 % (ref 11.0–15.0)
TOTAL LYMPHOCYTE: 31.9 %
WBC mixed population: 508 cells/uL (ref 200–950)
WBC: 5.4 10*3/uL (ref 3.8–10.8)

## 2017-11-08 LAB — PSA: PSA: 0.4 ng/mL (ref <=4.0)

## 2017-11-08 LAB — HEMOGLOBIN A1C: Hgb A1c MFr Bld: >14 % of total Hgb — ABNORMAL HIGH (ref <5.7)

## 2017-11-08 LAB — MICROALBUMIN, URINE: Microalb, Ur: 3.6 mg/dL

## 2017-11-13 ENCOUNTER — Other Ambulatory Visit: Payer: Self-pay | Admitting: Family Medicine

## 2017-11-13 DIAGNOSIS — E1165 Type 2 diabetes mellitus with hyperglycemia: Principal | ICD-10-CM

## 2017-11-13 DIAGNOSIS — IMO0001 Reserved for inherently not codable concepts without codable children: Secondary | ICD-10-CM

## 2017-12-06 ENCOUNTER — Encounter (INDEPENDENT_AMBULATORY_CARE_PROVIDER_SITE_OTHER): Admitting: Ophthalmology

## 2017-12-09 NOTE — Progress Notes (Signed)
[facility] Note  12/10/2017     CHIEF COMPLAINT Patient presents for Diabetic Eye Exam   HISTORY OF PRESENT ILLNESS: Marvin Spencer is a 52 y.o. male who presents to the clinic today for:   HPI    Diabetic Eye Exam    Vision is blurred for near.  Associated Symptoms Weight Loss.  Negative for Flashes, Floaters, Distortion, Blind Spot, Glare, Shoulder/Hip pain, Photophobia, Jaw Claudication, Fatigue, Scalp Tenderness, Redness, Pain, Trauma and Fever.  Diabetes characteristics include Type 2.  This started 23 years ago.  Blood sugar level is uncontrolled.  Last Blood Glucose 95.  Last A1C 14.  I, the attending physician,  performed the HPI with the patient and updated documentation appropriately.          Comments    Ret Eval per Dr. Dennard Schaumann Patient states having trouble with near vision. Patient MD wants to start insulin.       Last edited by Bernarda Caffey, MD on 12/10/2017  2:49 PM. (History)    pt states Dr. Dennard Schaumann is his PCP and sent him for a DM exam, pt states his last A1C was 14, pt is unsure why his blood sugar is out of control, states he used to work a very stressful job, pt states he is not on insulin, but PCP suggested he start it, pt states he is not ready for that, pt states he feels his eyes are doing pretty good, he does not have any problems with his vision, he states he needs reading glasses, but that is all  Referring physician: Susy Frizzle, MD [address] [place], [place] [address]  HISTORICAL INFORMATION:   Selected notes from the MEDICAL RECORD NUMBER Referred by Dr.  Truman Hayward:  Ocular Hx- PMH-DM, HLD, HTN, Peyronia disease    CURRENT MEDICATIONS: No current outpatient medications on file. (Ophthalmic Drugs)   No current facility-administered medications for this visit.  (Ophthalmic Drugs)   Current Outpatient Medications (Other)  Medication Sig  . glimepiride (AMARYL) 4 MG tablet Take 1 tablet (4 mg total) by  mouth 2 (two) times daily.  . metFORMIN (GLUCOPHAGE) 1000 MG tablet Take 1 tablet (1,000 mg total) by mouth 2 (two) times daily with a meal.  . aspirin EC 81 MG tablet Take 81 mg by mouth daily.  . pravastatin (PRAVACHOL) 20 MG tablet Take 20 mg by mouth daily.   Current Facility-Administered Medications (Other)  Medication Route  . 0.9 %  sodium chloride infusion Intravenous      REVIEW OF SYSTEMS: ROS    Negative for: Constitutional, Gastrointestinal, Neurological, Skin, Genitourinary, Musculoskeletal, HENT, Cardiovascular, Eyes, Respiratory, Psychiatric, Allergic/Imm, Heme/Lymph   Last edited by Elmore Guise on 12/10/2017  2:08 PM. (History)       ALLERGIES No Known Allergies  PAST MEDICAL HISTORY Past Medical History:  Diagnosis Date  . Diabetes mellitus without complication ([place])   . Hyperlipidemia   . Hypertriglyceridemia   . Peyronie disease    Past Surgical History:  Procedure Laterality Date  . CIRCUMCISION     age 82    FAMILY HISTORY Family History  Problem Relation Age of Onset  . Diabetes Mellitus II Unknown   . Glaucoma Unknown   . Colon cancer Neg Hx   . Colon polyps Neg Hx   . Esophageal cancer Neg Hx   . Rectal cancer Neg Hx   . Stomach cancer Neg Hx     SOCIAL HISTORY  Social History   Tobacco Use  . Smoking status: Never Smoker  . Smokeless tobacco: Never Used  Substance Use Topics  . Alcohol use: Yes  . Drug use: Not on file         OPHTHALMIC EXAM:  Base Eye Exam    Visual Acuity (Snellen - Linear)      Right Left   Dist [place] 20/20-2 20/20-1   Dist ph [place] 20/20-1 20/20       Tonometry (Tonopen, 2:15 PM)      Right Left   Pressure 22 22       Pupils      Dark Light Shape React APD   Right 3 2 Round Minimal None   Left 3 2 Round Minimal None       Visual Fields (Counting fingers)      Left Right    Full Full       Extraocular Movement      Right Left    Full Full       Neuro/Psych    Oriented x3:  Yes    Mood/Affect:  Normal       Dilation    Both eyes:  2.5% Phenylephrine @ 2:15 PM        Slit Lamp and Fundus Exam    Slit Lamp Exam      Right Left   Lids/Lashes mild Dermatochalasis - upper lid, mild Meibomian gland dysfunction mild Dermatochalasis - upper lid, mild Meibomian gland dysfunction   Conjunctiva/Sclera nasal and temporal Pinguecula nasal and temporal Pinguecula   Cornea Clear Clear   Anterior Chamber Deep and clear  Deep and clear    Iris Round and well dilated Round and well dilated   Lens 1+ Cortical cataract, 2+ Nuclear sclerosis 1+ Cortical cataract, 2+ Nuclear sclerosis   Vitreous mild Vitreous syneresis mild Vitreous syneresis       Fundus Exam      Right Left   Disc Pink and Sharp Pink and Sharp   C/D Ratio 0.1 0.1   Macula Flat, Good foveal reflex, punctate Exudates IT and SN to fovea, RPE mottling  Flat, Good foveal reflex, mild RPE mottling, few MA inferior to fovea, focal punctate and MA superior to fovea   Vessels Vascular attenuation, mild Tortuousity Vascular attenuation, mild Tortuousity   Periphery Attached, No heme  Attached, No heme           IMAGING AND PROCEDURES  Imaging and Procedures for '@TODAY'$ @  OCT, Retina - OU - Both Eyes       Right Eye Quality was good. Central Foveal Thickness: 258. Progression has no prior data. Findings include normal foveal contour, no IRF, no SRF.   Left Eye Quality was good. Central Foveal Thickness: 264. Progression has no prior data. Findings include normal foveal contour, no SRF, no IRF (Trace cystic changes superior to fovea).   Notes *Images captured and stored on drive  Diagnosis / Impression:  NFP, no IRF/SRF OU No DME OU   Clinical management:  See below  Abbreviations: NFP - Normal foveal profile. CME - cystoid macular edema. PED - pigment epithelial detachment. IRF - intraretinal fluid. SRF - subretinal fluid. EZ - ellipsoid zone. ERM - epiretinal membrane. ORA - outer retinal atrophy. ORT  - outer retinal tubulation. SRHM - subretinal hyper-reflective material                 ASSESSMENT/PLAN:    ICD-10-CM   1. Mild nonproliferative diabetic retinopathy of  both eyes without macular edema associated with type 2 diabetes mellitus ([place]) H82.9937   2. Retinal edema H35.81 OCT, Retina - OU - Both Eyes  3. Essential hypertension I10   4. Hypertensive retinopathy of both eyes H35.033   5. Combined forms of age-related cataract of both eyes H25.813   6. Presbyopia of both eyes H52.4     1,2. Nonproliferative diabetic retinopathy w/o DME, OU - The incidence, risk factors for progression, natural history and treatment options for diabetic retinopathy were discussed with patient.   - The need for close monitoring of blood glucose, blood pressure, and serum lipids, avoiding cigarette or any type of tobacco, and the need for long term follow up was also discussed with patient. - last A1c 14.0 (9.19.19) -- discussed severity of A1c and potential impact on eyes and vision - exam shows scattered MA, intraretinal hemorrhages; no neovascularization or DME OU - OCT without diabetic macular edema, OU  - f/u in 6 mos -- DFE/OCT  3,4. Hypertensive retinopathy OU - discussed importance of tight BP control - monitor  5. Mixed form cataracts - The symptoms of cataract, surgical options, and treatments and risks were discussed with patient. - discussed diagnosis and progression - not yet visually significant - monitor for now  6. Presbyopia - doing well with OTC readers  Ophthalmic Meds Ordered this visit:  No orders of the defined types were placed in this encounter.      Return in about 6 months (around 06/11/2018) for F/U NPDR OU, DFE, OCT.  There are no Patient Instructions on file for this visit.   Explained the diagnoses, plan, and follow up with the patient and they expressed understanding.  Patient expressed understanding of the importance of proper follow up care.    This document serves as a record of services personally performed by Gardiner Sleeper, MD, PhD. It was created on their behalf by Ernest Mallick, OA, an ophthalmic assistant. The creation of this record is the provider's dictation and/or activities during the visit.    Electronically signed by: Ernest Mallick, OA  10.21.19 8:20 AM    Gardiner Sleeper, M.D., Ph.D. Diseases & Surgery of the Retina and Vitreous [facility]   I have reviewed the above documentation for accuracy and completeness, and I agree with the above. Gardiner Sleeper, M.D., Ph.D. 12/11/17 8:20 AM    Abbreviations: M myopia (nearsighted); A astigmatism; H hyperopia (farsighted); P presbyopia; Mrx spectacle prescription;  CTL contact lenses; OD right eye; OS left eye; OU both eyes  XT exotropia; ET esotropia; PEK punctate epithelial keratitis; PEE punctate epithelial erosions; DES dry eye syndrome; MGD meibomian gland dysfunction; ATs artificial tears; PFAT's preservative free artificial tears; [place] nuclear sclerotic cataract; PSC posterior subcapsular cataract; ERM epi-retinal membrane; PVD posterior vitreous detachment; RD retinal detachment; DM diabetes mellitus; DR diabetic retinopathy; NPDR non-proliferative diabetic retinopathy; PDR proliferative diabetic retinopathy; CSME clinically significant macular edema; DME diabetic macular edema; dbh dot blot hemorrhages; CWS cotton wool spot; POAG primary open angle glaucoma; C/D cup-to-disc ratio; HVF humphrey visual field; GVF goldmann visual field; OCT optical coherence tomography; IOP intraocular pressure; BRVO Branch retinal vein occlusion; CRVO central retinal vein occlusion; CRAO central retinal artery occlusion; BRAO branch retinal artery occlusion; RT retinal tear; SB scleral buckle; PPV pars plana vitrectomy; VH Vitreous hemorrhage; PRP panretinal laser photocoagulation; IVK intravitreal kenalog; VMT vitreomacular traction; MH Macular hole;  NVD  neovascularization of the disc; NVE neovascularization elsewhere; AREDS age related eye  disease study; ARMD age related macular degeneration; POAG primary open angle glaucoma; EBMD epithelial/anterior basement membrane dystrophy; ACIOL anterior chamber intraocular lens; IOL intraocular lens; PCIOL posterior chamber intraocular lens; Phaco/IOL phacoemulsification with intraocular lens placement; [place] photorefractive keratectomy; LASIK laser assisted in situ keratomileusis; HTN hypertension; DM diabetes mellitus; COPD chronic obstructive pulmonary disease

## 2017-12-10 ENCOUNTER — Encounter (INDEPENDENT_AMBULATORY_CARE_PROVIDER_SITE_OTHER): Payer: Self-pay | Admitting: Ophthalmology

## 2017-12-10 ENCOUNTER — Ambulatory Visit (INDEPENDENT_AMBULATORY_CARE_PROVIDER_SITE_OTHER): Admitting: Ophthalmology

## 2017-12-10 DIAGNOSIS — H35033 Hypertensive retinopathy, bilateral: Secondary | ICD-10-CM

## 2017-12-10 DIAGNOSIS — E113293 Type 2 diabetes mellitus with mild nonproliferative diabetic retinopathy without macular edema, bilateral: Secondary | ICD-10-CM

## 2017-12-10 DIAGNOSIS — H3581 Retinal edema: Secondary | ICD-10-CM

## 2017-12-10 DIAGNOSIS — H524 Presbyopia: Secondary | ICD-10-CM

## 2017-12-10 DIAGNOSIS — I10 Essential (primary) hypertension: Secondary | ICD-10-CM

## 2017-12-10 DIAGNOSIS — H25813 Combined forms of age-related cataract, bilateral: Secondary | ICD-10-CM

## 2017-12-10 LAB — HM DIABETES EYE EXAM

## 2017-12-11 ENCOUNTER — Encounter (INDEPENDENT_AMBULATORY_CARE_PROVIDER_SITE_OTHER): Payer: Self-pay | Admitting: Ophthalmology

## 2018-02-20 ENCOUNTER — Encounter: Payer: Self-pay | Admitting: *Deleted

## 2019-04-18 ENCOUNTER — Ambulatory Visit: Attending: Internal Medicine

## 2019-04-18 DIAGNOSIS — Z23 Encounter for immunization: Secondary | ICD-10-CM | POA: Insufficient documentation

## 2019-04-18 NOTE — Progress Notes (Signed)
   Covid-19 Vaccination Clinic  Name:  Marvin Spencer    MRN: [id number] DOB: 12-26-1965  04/18/2019  Mr. Marvin Spencer was observed post Covid-19 immunization for 15 minutes without incidence. He was provided with Vaccine Information Sheet and instruction to access the V-Safe system.   Mr. Marvin Spencer was instructed to call 911 with any severe reactions post vaccine: Marland Kitchen Difficulty breathing  . Swelling of your face and throat  . A fast heartbeat  . A bad rash all over your body  . Dizziness and weakness    Immunizations Administered    Name Date Dose VIS Date Route   Pfizer COVID-19 Vaccine 04/18/2019  6:05 PM 0.3 mL 01/30/2019 Intramuscular   Manufacturer: [place]   Lot: [id number]   [place]: [id number]

## 2019-05-09 ENCOUNTER — Other Ambulatory Visit: Payer: Self-pay

## 2019-05-09 ENCOUNTER — Ambulatory Visit: Attending: Internal Medicine

## 2019-05-09 DIAGNOSIS — Z23 Encounter for immunization: Secondary | ICD-10-CM

## 2019-05-09 NOTE — Progress Notes (Signed)
   Covid-19 Vaccination Clinic  Name:  Marvin Spencer    MRN: [id number] DOB: 1965-04-24  05/09/2019  Mr. Marvin Spencer was observed post Covid-19 immunization for 15 minutes without incident. He was provided with Vaccine Information Sheet and instruction to access the V-Safe system.   Mr. Marvin Spencer was instructed to call 911 with any severe reactions post vaccine: Marland Kitchen Difficulty breathing  . Swelling of face and throat  . A fast heartbeat  . A bad rash all over body  . Dizziness and weakness   Immunizations Administered    Name Date Dose VIS Date Route   Pfizer COVID-19 Vaccine 05/09/2019  8:49 AM 0.3 mL 01/30/2019 Intramuscular   Manufacturer: [place]   Lot: [id number]   [place]: [id number]

## 2019-06-18 ENCOUNTER — Encounter: Payer: Self-pay | Admitting: Family Medicine

## 2019-06-18 ENCOUNTER — Other Ambulatory Visit: Payer: Self-pay

## 2019-06-18 ENCOUNTER — Ambulatory Visit (INDEPENDENT_AMBULATORY_CARE_PROVIDER_SITE_OTHER): Admitting: Family Medicine

## 2019-06-18 VITALS — BP 128/64 | HR 78 | Temp 98.7°F | Resp 16 | Ht 68.0 in | Wt 154.0 lb

## 2019-06-18 DIAGNOSIS — E1300 Other specified diabetes mellitus with hyperosmolarity without nonketotic hyperglycemic-hyperosmolar coma (NKHHC): Secondary | ICD-10-CM

## 2019-06-18 NOTE — Progress Notes (Signed)
Subjective:    Patient ID: Marvin Spencer, male    DOB: [birth year], 54 y.o.   MRN: [id number]  Medication Refill    11/2015 Patient has previously been seeing an endocrinologist. However, he has not seen an endocrinologist in quite some time. He is currently on glimepiride and metformin. He is not checking his sugars but he reports polyuria, polydipsia, and blurred vision. He has been off Januvia and pravastatin for approximately 2 months. He is taking an aspirin. His blood pressure is slightly elevated. He is due for hemoglobin A1c, fasting lab work, and a urine microalbumin. He states that his fasting blood sugars in the morning of the last 2 days a been 200.  At that time, my plan was: Blood pressure is elevated. I will check a urine microalbumin along with renal function. We will likely start the patient on losartan 50 mg by mouth daily depending upon lab work. I will also check a hemoglobin A1c and depending upon the level, I believe I will likely discontinue the Januvia he was previously taking and replace it with [facility] 5/25 poqday.  Check fasting lipid panel. Goal LDL cholesterol is less than 100. The patient will likely need to resume pravastatin  11/27/2016 Patient has not been seen in 1 year or had lab work.  He did have a diabetic eye exam in 12/2015.  He reports that he is not checking his blood sugar.  When he does, his blood sugar is greater then 300. He does report polyuria, polydipsia area and he denies blurry vision. He does demonstrate weight loss since his last visit despite eating a regular diet. His complexion is paler than before and he does look dehydrated.  He denies any chest pain shortness of breath dyspnea on exertion. He has not taken any medication in more than a month. Prior to that he was only on Amaryl and metformin. He is due for prostate cancer screening as well as a colonoscopy but he defers a colonoscopy at the present time.  At that time, my plan was: Blood  pressure is acceptable today. However I anticipate his hemoglobin A1c will be greater than 13. If greater than 13, I will start the patient on insulin rather than add oral medication. I suspect that he will require Lantus. Await the results of lab work. I will also check a fasting lipid panel. Goal LDL cholesterol is less than 100. Patient received his pneumonia vaccine today. He is already received his flu shot. Diabetic eye exam is up-to-date and diabetic foot exam was performed today and is normal. Given his weight loss, I will screen the patient for prostate cancer with a PSA and I will also check a TSH that is due secondary to his diabetes. He declines a colonoscopy at the present time  11/07/17 Patient's hemoglobin A1c a year ago was 13.8.  He refused to try insulin.  At that time we started the patient on Amaryl and metformin due to her variety of issues.  He then did not follow-up as planned.  He was lost to follow-up for more than a year.  Eventually we had to refused to refill his medication in order for him to come back to be seen.  He is here today for follow-up.  He has been off metformin and Amaryl for more than a month.  He reports polyuria.  He reports blurry vision.  His weight is down 3 pounds from his last visit.  He denies chest pain shortness of breath  or dyspnea on exertion.  He denies any neuropathy in his feet.  He denies any myalgias or right upper quadrant pain although he has not been taking his statin either.  At that time, my plan was: I spent more than 30 minutes today with the patient explaining the risk of uncontrolled diabetes including blindness, heart attacks, strokes, kidney failure, peripheral vascular disease, and amputation.  I explained to him that this is likely going to happen soon if he does not begin to make an effort in managing his disease.  I also told the patient that I will be glad to work with him but that I need him to meet me halfway.  At the present time he is  not actively managing his disease.  Therefore I will check baseline lab work to determine where we stand.  I did give the patient a prescription for Amaryl today that he can begin into the lab work comes back however if his hemoglobin A1c is greater than 13 I will insist on him using insulin or I will insist on him seeing an endocrinologist.  I will also check a fasting lipid panel and a urine microalbumin and while checking lab work I will check a PSA.  His blood pressure is controlled.  He did receive his flu shot  06/18/19 I have not seen the patient since September 2019.  He last saw his endocrinologist in February 2020.  At that time his hemoglobin A1c was near 13.  His C-peptide level was extremely low.  The endocrinologist had recommended insulin however due to a needle phobia, the patient was unable to start insulin at that time.  He started [place].  However in the interval time, he has stopped the [place].  Today he is only on the glimepiride as well as the Metformin.  He is not fasting today.  He denies any blurry vision.  He denies any polydipsia however he does report polyuria.  He is not checking his sugars frequently.  When he does check his sugars he will see 200s and 300s.  He states occasionally it will be in the 100s but that she usually if he is skipped a meal or exercised heavily.  He denies any chest pain shortness of breath or dyspnea on exertion.  He is due for an eye exam as well as a diabetic foot exam.  Diabetic foot exam was performed today and is completely normal.  He declines HIV screening.  He is already had his Covid vaccination.  Past Medical History:  Diagnosis Date  . Diabetes mellitus without complication ([place])   . Hyperlipidemia   . Hypertriglyceridemia   . Peyronie disease    Past Surgical History:  Procedure Laterality Date  . CIRCUMCISION     age 29   Current Outpatient Medications on File Prior to Visit  Medication Sig Dispense Refill  . glimepiride  (AMARYL) 4 MG tablet Take 1 tablet (4 mg total) by mouth 2 (two) times daily. 60 tablet 3  . metFORMIN (GLUCOPHAGE) 1000 MG tablet Take 1 tablet (1,000 mg total) by mouth 2 (two) times daily with a meal. 60 tablet 3  . aspirin EC 81 MG tablet Take 81 mg by mouth daily.    . pravastatin (PRAVACHOL) 20 MG tablet Take 20 mg by mouth daily.  0   Current Facility-Administered Medications on File Prior to Visit  Medication Dose Route Frequency Provider Last Rate Last Admin  . 0.9 %  sodium chloride infusion  500 mL Intravenous  Once Mauri Pole, MD       No Known Allergies Social History   Socioeconomic History  . Marital status: Married    Spouse name: Not on file  . Number of children: Not on file  . Years of education: Not on file  . Highest education level: Not on file  Occupational History  . Not on file  Tobacco Use  . Smoking status: Never Smoker  . Smokeless tobacco: Never Used  Substance and Sexual Activity  . Alcohol use: Yes  . Drug use: Not on file  . Sexual activity: Not on file  Other Topics Concern  . Not on file  Social History Narrative  . Not on file   Social Determinants of Health   Financial Resource Strain:   . Difficulty of Paying Living Expenses:   Food Insecurity:   . Worried About Charity fundraiser in the Last Year:   . Arboriculturist in the Last Year:   Transportation Needs:   . Film/video editor (Medical):   Marland Kitchen Lack of Transportation (Non-Medical):   Physical Activity:   . Days of Exercise per Week:   . Minutes of Exercise per Session:   Stress:   . Feeling of Stress :   Social Connections:   . Frequency of Communication with Friends and Family:   . Frequency of Social Gatherings with Friends and Family:   . Attends Religious Services:   . Active Member of Clubs or Organizations:   . Attends Archivist Meetings:   Marland Kitchen Marital Status:   Intimate Partner Violence:   . Fear of Current or Ex-Partner:   . Emotionally  Abused:   Marland Kitchen Physically Abused:   . Sexually Abused:       Review of Systems  All other systems reviewed and are negative.      Objective:   Physical Exam  Neck: No JVD present.  Cardiovascular: Normal rate, regular rhythm and normal heart sounds.  No murmur heard. Pulmonary/Chest: Effort normal and breath sounds normal. No respiratory distress. He has no wheezes. He has no rales.  Abdominal: Soft. Bowel sounds are normal.  Musculoskeletal:        General: No edema.     Cervical back: Neck supple.  Vitals reviewed.         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  Based on his last C-peptide level, I strongly encouraged the patient to consider insulin.  As I explained to the patient, if his pancreas is unable to make insulin, most of the oral medications are ineffective.  Essentially, if his C-peptide level is low, he has become a "type I diabetic".  Patient seems open to possibly starting insulin if that is necessary.  Therefore we will check a hemoglobin A1c today.  I will also check a C-peptide level, urine microalbumin, CMP, and a CBC.  I did refer the patient to endocrinology in [place].  Patient request a new endocrinologist due to having to travel too far to his old endocrinologist office.  However if his A1c is above 12, I would likely start the patient on Lantus until he can establish with his new endocrinologist.

## 2019-06-19 ENCOUNTER — Encounter: Payer: Self-pay | Admitting: Family Medicine

## 2019-06-19 ENCOUNTER — Ambulatory Visit (INDEPENDENT_AMBULATORY_CARE_PROVIDER_SITE_OTHER): Admitting: Family Medicine

## 2019-06-19 ENCOUNTER — Telehealth: Payer: Self-pay | Admitting: Family Medicine

## 2019-06-19 VITALS — BP 128/78 | HR 78 | Temp 96.8°F | Resp 12 | Ht 68.0 in | Wt 154.0 lb

## 2019-06-19 DIAGNOSIS — E119 Type 2 diabetes mellitus without complications: Secondary | ICD-10-CM

## 2019-06-19 DIAGNOSIS — Z794 Long term (current) use of insulin: Secondary | ICD-10-CM | POA: Diagnosis not present

## 2019-06-19 LAB — CBC WITH DIFFERENTIAL/PLATELET
Absolute Monocytes: 462 cells/uL (ref 200–950)
Basophils Absolute: 33 cells/uL (ref 0–200)
Basophils Relative: 0.5 %
Eosinophils Absolute: 33 cells/uL (ref 15–500)
Eosinophils Relative: 0.5 %
HCT: 42.4 % (ref 38.5–50.0)
Hemoglobin: 13.9 g/dL (ref 13.2–17.1)
Lymphs Abs: 2000 cells/uL (ref 850–3900)
MCH: 28.7 pg (ref 27.0–33.0)
MCHC: 32.8 g/dL (ref 32.0–36.0)
MCV: 87.4 fL (ref 80.0–100.0)
MPV: 11.1 fL (ref 7.5–12.5)
Monocytes Relative: 7 %
Neutro Abs: 4072 cells/uL (ref 1500–7800)
Neutrophils Relative %: 61.7 %
Platelets: 258 10*3/uL (ref 140–400)
RBC: 4.85 10*6/uL (ref 4.20–5.80)
RDW: 12.1 % (ref 11.0–15.0)
Total Lymphocyte: 30.3 %
WBC: 6.6 10*3/uL (ref 3.8–10.8)

## 2019-06-19 LAB — HEMOGLOBIN A1C: Hgb A1c MFr Bld: >14 % of total Hgb — ABNORMAL HIGH (ref <5.7)

## 2019-06-19 LAB — COMPLETE METABOLIC PANEL WITH GFR
AG Ratio: 1.7 (calc) (ref 1.0–2.5)
ALT: 10 U/L (ref 9–46)
AST: 13 U/L (ref 10–35)
Albumin: 4.1 g/dL (ref 3.6–5.1)
Alkaline phosphatase (APISO): 106 U/L (ref 35–144)
BUN: 14 mg/dL (ref 7–25)
CO2: 25 mmol/L (ref 20–32)
Calcium: 9 mg/dL (ref 8.6–10.3)
Chloride: 98 mmol/L (ref 98–110)
Creat: 0.83 mg/dL (ref 0.70–1.33)
GFR, Est African American: 116 mL/min/{1.73_m2} (ref >=60)
GFR, Est Non African American: 100 mL/min/{1.73_m2} (ref >=60)
Globulin: 2.4 g/dL (calc) (ref 1.9–3.7)
Glucose, Bld: 500 mg/dL (ref 65–99)
Potassium: 4.3 mmol/L (ref 3.5–5.3)
Sodium: 132 mmol/L — ABNORMAL LOW (ref 135–146)
Total Bilirubin: 0.5 mg/dL (ref 0.2–1.2)
Total Protein: 6.5 g/dL (ref 6.1–8.1)

## 2019-06-19 LAB — C-PEPTIDE: C-Peptide: 1.25 ng/mL (ref 0.80–3.85)

## 2019-06-19 LAB — MICROALBUMIN, URINE: Microalb, Ur: 1.1 mg/dL

## 2019-06-19 NOTE — Telephone Encounter (Signed)
Critical value called in Pt glucose 500, with normal anion gap and renal function Reviewed chart , non compliant with f/u and meds prescribed Needs insulin for years has declined Defer to PCP to discuss insulin therapy Labs were taken from 3pm yesterday  Will call  Pt in AM

## 2019-06-19 NOTE — Progress Notes (Signed)
Subjective:    Patient ID: Marvin Spencer, male    DOB: 09-09-1965, 54 y.o.   MRN: [id number]  Medication Refill    11/2015 Patient has previously been seeing an endocrinologist. However, he has not seen an endocrinologist in quite some time. He is currently on glimepiride and metformin. He is not checking his sugars but he reports polyuria, polydipsia, and blurred vision. He has been off Januvia and pravastatin for approximately 2 months. He is taking an aspirin. His blood pressure is slightly elevated. He is due for hemoglobin A1c, fasting lab work, and a urine microalbumin. He states that his fasting blood sugars in the morning of the last 2 days a been 200.  At that time, my plan was: Blood pressure is elevated. I will check a urine microalbumin along with renal function. We will likely start the patient on losartan 50 mg by mouth daily depending upon lab work. I will also check a hemoglobin A1c and depending upon the level, I believe I will likely discontinue the Januvia he was previously taking and replace it with [facility] 5/25 poqday.  Check fasting lipid panel. Goal LDL cholesterol is less than 100. The patient will likely need to resume pravastatin  11/27/2016 Patient has not been seen in 1 year or had lab work.  He did have a diabetic eye exam in 12/2015.  He reports that he is not checking his blood sugar.  When he does, his blood sugar is greater then 300. He does report polyuria, polydipsia area and he denies blurry vision. He does demonstrate weight loss since his last visit despite eating a regular diet. His complexion is paler than before and he does look dehydrated.  He denies any chest pain shortness of breath dyspnea on exertion. He has not taken any medication in more than a month. Prior to that he was only on Amaryl and metformin. He is due for prostate cancer screening as well as a colonoscopy but he defers a colonoscopy at the present time.  At that time, my plan was: Blood  pressure is acceptable today. However I anticipate his hemoglobin A1c will be greater than 13. If greater than 13, I will start the patient on insulin rather than add oral medication. I suspect that he will require Lantus. Await the results of lab work. I will also check a fasting lipid panel. Goal LDL cholesterol is less than 100. Patient received his pneumonia vaccine today. He is already received his flu shot. Diabetic eye exam is up-to-date and diabetic foot exam was performed today and is normal. Given his weight loss, I will screen the patient for prostate cancer with a PSA and I will also check a TSH that is due secondary to his diabetes. He declines a colonoscopy at the present time  11/07/17 Patient's hemoglobin A1c a year ago was 13.8.  He refused to try insulin.  At that time we started the patient on Amaryl and metformin due to her variety of issues.  He then did not follow-up as planned.  He was lost to follow-up for more than a year.  Eventually we had to refused to refill his medication in order for him to come back to be seen.  He is here today for follow-up.  He has been off metformin and Amaryl for more than a month.  He reports polyuria.  He reports blurry vision.  His weight is down 3 pounds from his last visit.  He denies chest pain shortness of breath  or dyspnea on exertion.  He denies any neuropathy in his feet.  He denies any myalgias or right upper quadrant pain although he has not been taking his statin either.  At that time, my plan was: I spent more than 30 minutes today with the patient explaining the risk of uncontrolled diabetes including blindness, heart attacks, strokes, kidney failure, peripheral vascular disease, and amputation.  I explained to him that this is likely going to happen soon if he does not begin to make an effort in managing his disease.  I also told the patient that I will be glad to work with him but that I need him to meet me halfway.  At the present time he is  not actively managing his disease.  Therefore I will check baseline lab work to determine where we stand.  I did give the patient a prescription for Amaryl today that he can begin into the lab work comes back however if his hemoglobin A1c is greater than 13 I will insist on him using insulin or I will insist on him seeing an endocrinologist.  I will also check a fasting lipid panel and a urine microalbumin and while checking lab work I will check a PSA.  His blood pressure is controlled.  He did receive his flu shot  06/18/19 I have not seen the patient since September 2019.  He last saw his endocrinologist in February 2020.  At that time his hemoglobin A1c was near 13.  His C-peptide level was extremely low.  The endocrinologist had recommended insulin however due to a needle phobia, the patient was unable to start insulin at that time.  He started [place].  However in the interval time, he has stopped the [place].  Today he is only on the glimepiride as well as the Metformin.  He is not fasting today.  He denies any blurry vision.  He denies any polydipsia however he does report polyuria.  He is not checking his sugars frequently.  When he does check his sugars he will see 200s and 300s.  He states occasionally it will be in the 100s but that she usually if he is skipped a meal or exercised heavily.  He denies any chest pain shortness of breath or dyspnea on exertion.  He is due for an eye exam as well as a diabetic foot exam.  Diabetic foot exam was performed today and is completely normal.  He declines HIV screening.  He is already had his Covid vaccination.  At that time, my plan was: I anticipate that his hemoglobin A1c will be between 12 and 13 with average blood sugars in the 300s based on his polyuria.  Based on his last C-peptide level, I strongly encouraged the patient to consider insulin.  As I explained to the patient, if his pancreas is unable to make insulin, most of the oral medications are  ineffective.  Essentially, if his C-peptide level is low, he has become a "type I diabetic".  Patient seems open to possibly starting insulin if that is necessary.  Therefore we will check a hemoglobin A1c today.  I will also check a C-peptide level, urine microalbumin, CMP, and a CBC.  I did refer the patient to endocrinology in [place].  Patient request a new endocrinologist due to having to travel too far to his old endocrinologist office.  However if his A1c is above 12, I would likely start the patient on Lantus until he can establish with his new endocrinologist.  06/19/19 Patient's hemoglobin A1c was greater than 14!Marland Kitchen  His blood sugar was over 500.  However he did not have an elevated anion gap.  There was no evidence of metabolic acidosis.  Therefore patient does not require hospitalization.  However we did bring him in today to discuss initiation of insulin.  Patient is now accepting of having to use insulin to control wounds.  Based on his weight, he needs approximately 35 to 70 units daily of insulin I would anticipate.  Past Medical History:  Diagnosis Date  . Diabetes mellitus without complication ([place])   . Hyperlipidemia   . Hypertriglyceridemia   . Peyronie disease    Past Surgical History:  Procedure Laterality Date  . CIRCUMCISION     age 42   Current Outpatient Medications on File Prior to Visit  Medication Sig Dispense Refill  . aspirin EC 81 MG tablet Take 81 mg by mouth daily.    Marland Kitchen glimepiride (AMARYL) 4 MG tablet Take 1 tablet (4 mg total) by mouth 2 (two) times daily. 60 tablet 3  . metFORMIN (GLUCOPHAGE) 1000 MG tablet Take 1 tablet (1,000 mg total) by mouth 2 (two) times daily with a meal. 60 tablet 3  . pravastatin (PRAVACHOL) 20 MG tablet Take 20 mg by mouth daily.  0   Current Facility-Administered Medications on File Prior to Visit  Medication Dose Route Frequency Provider Last Rate Last Admin  . 0.9 %  sodium chloride infusion  500 mL Intravenous Once  Nandigam, Venia Minks, MD       No Known Allergies Social History   Socioeconomic History  . Marital status: Married    Spouse name: Not on file  . Number of children: Not on file  . Years of education: Not on file  . Highest education level: Not on file  Occupational History  . Not on file  Tobacco Use  . Smoking status: Never Smoker  . Smokeless tobacco: Never Used  Substance and Sexual Activity  . Alcohol use: Yes  . Drug use: Not on file  . Sexual activity: Not on file  Other Topics Concern  . Not on file  Social History Narrative  . Not on file   Social Determinants of Health   Financial Resource Strain:   . Difficulty of Paying Living Expenses:   Food Insecurity:   . Worried About Charity fundraiser in the Last Year:   . Arboriculturist in the Last Year:   Transportation Needs:   . Film/video editor (Medical):   Marland Kitchen Lack of Transportation (Non-Medical):   Physical Activity:   . Days of Exercise per Week:   . Minutes of Exercise per Session:   Stress:   . Feeling of Stress :   Social Connections:   . Frequency of Communication with Friends and Family:   . Frequency of Social Gatherings with Friends and Family:   . Attends Religious Services:   . Active Member of Clubs or Organizations:   . Attends Archivist Meetings:   Marland Kitchen Marital Status:   Intimate Partner Violence:   . Fear of Current or Ex-Partner:   . Emotionally Abused:   Marland Kitchen Physically Abused:   . Sexually Abused:       Review of Systems  All other systems reviewed and are negative.      Objective:   Physical Exam  Neck: No JVD present.  Cardiovascular: Normal rate, regular rhythm and normal heart sounds.  No murmur heard. Pulmonary/Chest:  Effort normal and breath sounds normal. No respiratory distress. He has no wheezes. He has no rales.  Abdominal: Soft. Bowel sounds are normal.  Musculoskeletal:        General: No edema.     Cervical back: Neck supple.  Vitals  reviewed.    Office Visit on 06/18/2019  Component Date Value Ref Range Status  . WBC 06/18/2019 6.6  3.8 - 10.8 Thousand/uL Final  . RBC 06/18/2019 4.85  4.20 - 5.80 Million/uL Final  . Hemoglobin 06/18/2019 13.9  13.2 - 17.1 g/dL Final  . HCT 06/18/2019 42.4  38.5 - 50.0 % Final  . MCV 06/18/2019 87.4  80.0 - 100.0 fL Final  . MCH 06/18/2019 28.7  27.0 - 33.0 pg Final  . MCHC 06/18/2019 32.8  32.0 - 36.0 g/dL Final  . RDW 06/18/2019 12.1  11.0 - 15.0 % Final  . Platelets 06/18/2019 258  140 - 400 Thousand/uL Final  . MPV 06/18/2019 11.1  7.5 - 12.5 fL Final  . Neutro Abs 06/18/2019 4,072  1,500 - 7,800 cells/uL Final  . Lymphs Abs 06/18/2019 2,000  850 - 3,900 cells/uL Final  . Absolute Monocytes 06/18/2019 462  200 - 950 cells/uL Final  . Eosinophils Absolute 06/18/2019 33  15 - 500 cells/uL Final  . Basophils Absolute 06/18/2019 33  0 - 200 cells/uL Final  . Neutrophils Relative % 06/18/2019 61.7  % Final  . Total Lymphocyte 06/18/2019 30.3  % Final  . Monocytes Relative 06/18/2019 7.0  % Final  . Eosinophils Relative 06/18/2019 0.5  % Final  . Basophils Relative 06/18/2019 0.5  % Final  . Glucose, Bld 06/18/2019 500* 65 - 99 mg/dL Final   Comment: Verified by repeat analysis. Marland Kitchen .            Fasting reference interval . For someone without known diabetes, a glucose value >125 mg/dL indicates that they may have diabetes and this should be confirmed with a follow-up test. .   . BUN 06/18/2019 14  7 - 25 mg/dL Final  . Creat 06/18/2019 0.83  0.70 - 1.33 mg/dL Final   Comment: For patients >18 years of age, the reference limit for Creatinine is approximately 13% higher for people identified as African-American. .   . GFR, Est Non African American 06/18/2019 100  > OR = 60 mL/min/1.8m2 Final  . GFR, Est African American 06/18/2019 116  > OR = 60 mL/min/1.20m2 Final  . BUN/Creatinine Ratio [id number] NOT APPLICABLE  6 - 22 (calc) Final  . Sodium 06/18/2019 132* 135 -  146 mmol/L Final  . Potassium 06/18/2019 4.3  3.5 - 5.3 mmol/L Final  . Chloride 06/18/2019 98  98 - 110 mmol/L Final  . CO2 06/18/2019 25  20 - 32 mmol/L Final  . Calcium 06/18/2019 9.0  8.6 - 10.3 mg/dL Final  . Total Protein 06/18/2019 6.5  6.1 - 8.1 g/dL Final  . Albumin 06/18/2019 4.1  3.6 - 5.1 g/dL Final  . Globulin 06/18/2019 2.4  1.9 - 3.7 g/dL (calc) Final  . AG Ratio 06/18/2019 1.7  1.0 - 2.5 (calc) Final  . Total Bilirubin 06/18/2019 0.5  0.2 - 1.2 mg/dL Final  . Alkaline phosphatase (APISO) 06/18/2019 106  35 - 144 U/L Final  . AST 06/18/2019 13  10 - 35 U/L Final  . ALT 06/18/2019 10  9 - 46 U/L Final  . Microalb, Ur 06/18/2019 1.1  mg/dL Final   Comment: Reference Range Not established   . RAM  06/18/2019    Final   Comment: . The ADA defines abnormalities in albumin excretion as follows: Marland Kitchen Category         Result (mcg/mg creatinine) . Normal                    <30 Microalbuminuria         30-299  Clinical albuminuria   > OR = 300 . The ADA recommends that at least two of three specimens collected within a 3-6 month period be abnormal before considering a patient to be within a diagnostic category.   . Hgb A1c MFr Bld 06/18/2019 >14.0* <5.7 % of total Hgb Final   Comment: Verified by repeat analysis. . For someone without known diabetes, a hemoglobin A1c value of 6.5% or greater indicates that they may have  diabetes and this should be confirmed with a follow-up  test. . For someone with known diabetes, a value <7% indicates  that their diabetes is well controlled and a value  greater than or equal to 7% indicates suboptimal  control. A1c targets should be individualized based on  duration of diabetes, age, comorbid conditions, and  other considerations. . Currently, no consensus exists regarding use of hemoglobin A1c for diagnosis of diabetes for children. .   . Mean Plasma Glucose 06/18/2019   (calc) Final   Comment: eAG cannot be calculated.  Hemoglobin A1c result exceeds the linearity of the assay.   . C-Peptide 06/18/2019 1.25  0.80 - 3.85 ng/mL Final        Assessment & Plan:  Diabetes mellitus type 2, insulin dependent ([place])   Patient now has insulin-dependent diabetes mellitus.  Glimepiride is likely not showing any benefit.  However today the entire visit was spent in education.  I recommended starting Levemir 25 units once daily.  I recommended the patient increase his insulin by 1 unit every day until his fasting blood sugars are less than 150.  He will call me next week with an update of his current insulin dose as well as his current fasting blood sugars.  Make a gross adjustment in 1 week.  I anticipate somewhere between 30 and 70 units of insulin will be required.  Once his blood sugars are better we will discontinue glimepiride.  I have referred the patient to an new endocrinologist however I will attempt to get his blood sugars under better control due to the urgency of the matter.  If he develops nausea or vomiting or abdominal pain or severe fatigue he is to go to the emergency room immediately.  I also encouraged the patient to drink plenty of fluids over the weekend as he is most likely severely dehydrated.  Patient was given samples of Levemir and more than 20 minutes was spent in instruction of administration, demonstrating administration, and supervising administration.  Recheck via telephone next week or immediately if worse

## 2019-06-27 DIAGNOSIS — E785 Hyperlipidemia, unspecified: Secondary | ICD-10-CM | POA: Insufficient documentation

## 2019-06-27 DIAGNOSIS — E1169 Type 2 diabetes mellitus with other specified complication: Secondary | ICD-10-CM | POA: Insufficient documentation

## 2019-06-27 DIAGNOSIS — Z794 Long term (current) use of insulin: Secondary | ICD-10-CM | POA: Insufficient documentation

## 2019-06-27 DIAGNOSIS — E1165 Type 2 diabetes mellitus with hyperglycemia: Secondary | ICD-10-CM | POA: Insufficient documentation

## 2019-09-29 ENCOUNTER — Telehealth: Payer: Self-pay

## 2019-09-29 ENCOUNTER — Other Ambulatory Visit: Payer: Self-pay | Admitting: Family Medicine

## 2019-09-29 LAB — HM DIABETES EYE EXAM

## 2019-09-29 MED ORDER — SILDENAFIL CITRATE 100 MG PO TABS: 50.0000 mg | ORAL_TABLET | Freq: Every day | ORAL | 11 refills | Status: AC | PRN

## 2019-09-29 NOTE — Telephone Encounter (Signed)
Pt called for prescription, he's having erectile dysfunction and can't get an erectile.

## 2019-09-29 NOTE — Telephone Encounter (Signed)
Pt called asking for prescription for erectile dysfunction, he's not able to get an erection.

## 2019-09-29 NOTE — Telephone Encounter (Signed)
Sent viagra

## 2019-09-30 NOTE — Telephone Encounter (Signed)
Called Pt to let him an prescription had been sent by his provider.

## 2020-03-09 ENCOUNTER — Other Ambulatory Visit

## 2020-06-20 ENCOUNTER — Encounter: Payer: Self-pay | Admitting: Gastroenterology

## 2020-08-31 ENCOUNTER — Encounter: Payer: Self-pay | Admitting: Gastroenterology

## 2020-09-06 ENCOUNTER — Ambulatory Visit: Admitting: Family Medicine

## 2020-10-06 ENCOUNTER — Ambulatory Visit (AMBULATORY_SURGERY_CENTER): Admitting: *Deleted

## 2020-10-06 ENCOUNTER — Other Ambulatory Visit: Payer: Self-pay

## 2020-10-06 VITALS — Ht 68.0 in | Wt 160.0 lb

## 2020-10-06 DIAGNOSIS — Z86010 Personal history of colonic polyps: Secondary | ICD-10-CM

## 2020-10-06 MED ORDER — NA SULFATE-K SULFATE-MG SULF 17.5-3.13-1.6 GM/177ML PO SOLN: 1.0000 | Freq: Once | ORAL | 0 refills | Status: AC

## 2020-10-06 NOTE — Progress Notes (Signed)
Pt verified name, DOB, address and insurance during PV today. Pt mailed instruction packet to included paper to complete and mail back to [facility] with addressed and stamped envelope, Emmi video, copy of consent form to read and not return, and instructions.. PV completed over the phone. Pt encouraged to call with questions or issues.  No egg or soy allergy known to patient  No issues with past sedation with any surgeries or procedures Patient denies ever being told they had issues or difficulty with intubation  No FH of Malignant Hyperthermia No diet pills per patient No home 02 use per patient  No blood thinners per patient  Pt denies issues with constipation  No A fib or A flutter  EMMI video to pt or via [place] 19 guidelines implemented in [place] today with Pt and RN  Pt is fully vaccinated  for Covid   Due to the COVID-19 pandemic we are asking patients to follow certain guidelines.  Pt aware of COVID protocols and [facility] guidelines   Pt is asking we send his Suprep to his [place]- mail in Pharmacy -  pt is aware if he does not receive this in the next week he needs to call us to send the Suprep to [facility] in [place]- this was discussed with pt multiple times during Pv today that mail in Pharmacies are bad about not getting meds to pt's in time - he will call if not received - also will notify us if this is not the correct Pharmacy listed in [place]

## 2020-10-19 ENCOUNTER — Encounter: Admitting: Gastroenterology

## 2021-01-27 ENCOUNTER — Ambulatory Visit (AMBULATORY_SURGERY_CENTER): Payer: Self-pay

## 2021-01-27 ENCOUNTER — Other Ambulatory Visit: Payer: Self-pay

## 2021-01-27 VITALS — Ht 68.0 in | Wt 155.0 lb

## 2021-01-27 DIAGNOSIS — Z86010 Personal history of colonic polyps: Secondary | ICD-10-CM

## 2021-01-27 MED ORDER — CLENPIQ 10-3.5-12 MG-GM -GM/160ML PO SOLN: 1.0000 | Freq: Once | ORAL | 0 refills | Status: AC

## 2021-01-27 NOTE — Progress Notes (Signed)

## 2021-02-16 ENCOUNTER — Other Ambulatory Visit: Payer: Self-pay

## 2021-02-16 ENCOUNTER — Encounter: Payer: Self-pay | Admitting: Gastroenterology

## 2021-02-16 ENCOUNTER — Ambulatory Visit (AMBULATORY_SURGERY_CENTER): Admitting: Gastroenterology

## 2021-02-16 VITALS — BP 120/71 | HR 76 | Temp 97.1°F | Resp 15 | Ht 68.0 in | Wt 155.0 lb

## 2021-02-16 DIAGNOSIS — D120 Benign neoplasm of cecum: Secondary | ICD-10-CM

## 2021-02-16 DIAGNOSIS — Z86010 Personal history of colonic polyps: Secondary | ICD-10-CM

## 2021-02-16 MED ORDER — SODIUM CHLORIDE 0.9 % IV SOLN: 500.0000 mL | Freq: Once | INTRAVENOUS | Status: DC

## 2021-02-16 NOTE — Progress Notes (Signed)
Called to room to assist during endoscopic procedure.  Patient ID and intended procedure confirmed with present staff. Received instructions for my participation in the procedure from the performing physician.  

## 2021-02-16 NOTE — Progress Notes (Signed)
Report to PACU, RN, vss, BBS= Clear.  

## 2021-02-16 NOTE — Op Note (Signed)
[facility] Patient Name: Marvin Spencer Procedure Date: 02/16/2021 7:43 AM MRN: [id number] Endoscopist: Mauri Pole , MD Age: 55 Referring MD:  Date of Birth: 03-08-1965 Gender: Male Account #: [id number] Procedure:                Colonoscopy Indications:              High risk colon cancer surveillance: Personal                            history of colonic polyps Medicines:                Monitored Anesthesia Care Procedure:                Pre-Anesthesia Assessment:                           - Prior to the procedure, a History and Physical                            was performed, and patient medications and                            allergies were reviewed. The patient's tolerance of                            previous anesthesia was also reviewed. The risks                            and benefits of the procedure and the sedation                            options and risks were discussed with the patient.                            All questions were answered, and informed consent                            was obtained. Prior Anticoagulants: The patient has                            taken no previous anticoagulant or antiplatelet                            agents. ASA Grade Assessment: II - A patient with                            mild systemic disease. After reviewing the risks                            and benefits, the patient was deemed in                            satisfactory condition to undergo the procedure.  After obtaining informed consent, the colonoscope                            was passed under direct vision. Throughout the                            procedure, the patient's blood pressure, pulse, and                            oxygen saturations were monitored continuously. The                            PCF-HQ190L Colonoscope was introduced through the                            anus and advanced to the the  cecum, identified by                            appendiceal orifice and ileocecal valve. The                            colonoscopy was performed without difficulty. The                            patient tolerated the procedure well. The quality                            of the bowel preparation was good. The ileocecal                            valve, appendiceal orifice, and rectum were                            photographed. Scope In: 8:13:01 AM Scope Out: 8:23:49 AM Scope Withdrawal Time: 0 hours 6 minutes 56 seconds  Total Procedure Duration: 0 hours 10 minutes 48 seconds  Findings:                 The perianal and digital rectal examinations were                            normal.                           A 4 mm polyp was found in the cecum. The polyp was                            sessile. The polyp was removed with a cold snare.                            Resection and retrieval were complete.                           Non-bleeding internal hemorrhoids were found during  retroflexion. The hemorrhoids were medium-sized. Complications:            No immediate complications. Estimated Blood Loss:     Estimated blood loss was minimal. Impression:               - One 4 mm polyp in the cecum, removed with a cold                            snare. Resected and retrieved.                           - Non-bleeding internal hemorrhoids. Recommendation:           - Patient has a contact number available for                            emergencies. The signs and symptoms of potential                            delayed complications were discussed with the                            patient. Return to normal activities tomorrow.                            Written discharge instructions were provided to the                            patient.                           - Resume previous diet.                           - Continue present medications.                            - Await pathology results.                           - Repeat colonoscopy in 5-10 years for surveillance                            based on pathology results. Mauri Pole, MD 02/16/2021 8:32:17 AM This report has been signed electronically.

## 2021-02-16 NOTE — Progress Notes (Signed)
Pt's states no medical or surgical changes since previsit or office visit. VS by CW. 

## 2021-02-16 NOTE — Progress Notes (Signed)
[facility] History and Physical   Primary Care Physician:  Susy Frizzle, MD   Reason for Procedure:  History of adenomatous colon polyps  Plan:    Surveillance colonoscopy with possible interventions as needed     HPI: Marvin Spencer is a very pleasant 55 y.o. male here for surveillance colonoscopy. Denies any nausea, vomiting, abdominal pain, melena or bright red blood per rectum  The risks and benefits as well as alternatives of endoscopic procedure(s) have been discussed and reviewed. All questions answered. The patient agrees to proceed.    Past Medical History:  Diagnosis Date   Diabetes mellitus without complication ([place])    Hyperlipidemia    Hypertriglyceridemia    Peyronie disease     Past Surgical History:  Procedure Laterality Date   CIRCUMCISION     age 89   COLONOSCOPY     POLYPECTOMY      Prior to Admission medications   Medication Sig Start Date End Date Taking? Authorizing Provider  COMFORT EZ PEN NEEDLES 31G X 8 MM MISC  09/02/20  Yes [provider]  glimepiride (AMARYL) 4 MG tablet Take 1 tablet (4 mg total) by mouth 2 (two) times daily. 11/07/17  Yes Susy Frizzle, MD  glucose blood (ACCU-CHEK GUIDE) test strip See admin instructions.   Yes [provider]  glucose blood (FREESTYLE LITE) test strip Use 3 times daily dx e11.9 12/01/19  Yes [provider]  LANTUS SOLOSTAR 100 UNIT/ML Solostar Pen Inject 45 Units into the skin daily. In the morning 09/02/20  Yes [provider]  metFORMIN (GLUCOPHAGE) 1000 MG tablet Take 1 tablet (1,000 mg total) by mouth 2 (two) times daily with a meal. 11/07/17  Yes Susy Frizzle, MD  aspirin EC 81 MG tablet Take 81 mg by mouth daily.    [provider]  Cinnamon 500 MG capsule See admin instructions.    [provider]  Continuous Blood Gluc Receiver (FREESTYLE LIBRE 14 DAY READER) [place] See admin instructions. Patient not taking: Reported  on 02/16/2021    [provider]  Continuous Blood Gluc Sensor (FREESTYLE LIBRE 14 DAY SENSOR) MISC See admin instructions. Patient not taking: Reported on 02/16/2021    [provider]  pravastatin (PRAVACHOL) 20 MG tablet Take 20 mg by mouth daily. 05/27/14   [provider]  sildenafil (VIAGRA) 100 MG tablet Take 0.5-1 tablets (50-100 mg total) by mouth daily as needed for erectile dysfunction. Patient not taking: Reported on 02/16/2021 09/29/19   Susy Frizzle, MD    Current Outpatient Medications  Medication Sig Dispense Refill   COMFORT EZ PEN NEEDLES 31G X 8 MM MISC      glimepiride (AMARYL) 4 MG tablet Take 1 tablet (4 mg total) by mouth 2 (two) times daily. 60 tablet 3   glucose blood (ACCU-CHEK GUIDE) test strip See admin instructions.     glucose blood (FREESTYLE LITE) test strip Use 3 times daily dx e11.9     LANTUS SOLOSTAR 100 UNIT/ML Solostar Pen Inject 45 Units into the skin daily. In the morning     metFORMIN (GLUCOPHAGE) 1000 MG tablet Take 1 tablet (1,000 mg total) by mouth 2 (two) times daily with a meal. 60 tablet 3   aspirin EC 81 MG tablet Take 81 mg by mouth daily.     Cinnamon 500 MG capsule See admin instructions.     Continuous Blood Gluc Receiver (FREESTYLE LIBRE 14 DAY READER) DEVI See admin instructions. (Patient not  taking: Reported on 02/16/2021)     Continuous Blood Gluc Sensor (FREESTYLE LIBRE 14 DAY SENSOR) MISC See admin instructions. (Patient not taking: Reported on 02/16/2021)     pravastatin (PRAVACHOL) 20 MG tablet Take 20 mg by mouth daily.  0   sildenafil (VIAGRA) 100 MG tablet Take 0.5-1 tablets (50-100 mg total) by mouth daily as needed for erectile dysfunction. (Patient not taking: Reported on 02/16/2021) 5 tablet 11   Current Facility-Administered Medications  Medication Dose Route Frequency Provider Last Rate Last Admin   0.9 %  sodium chloride infusion  500 mL Intravenous Once Ellenora Talton V, MD       0.9 %   sodium chloride infusion  500 mL Intravenous Once Mauri Pole, MD        Allergies as of 02/16/2021   (No Known Allergies)    Family History  Problem Relation Age of Onset   Diabetes Mellitus II Unknown    Glaucoma Unknown    Colon cancer Neg Hx    Colon polyps Neg Hx    Esophageal cancer Neg Hx    Rectal cancer Neg Hx    Stomach cancer Neg Hx     Social History   Socioeconomic History   Marital status: Married    Spouse name: Not on file   Number of children: Not on file   Years of education: Not on file   Highest education level: Not on file  Occupational History   Not on file  Tobacco Use   Smoking status: Never   Smokeless tobacco: Never  Vaping Use   Vaping Use: Never used  Substance and Sexual Activity   Alcohol use: Yes   Drug use: Never   Sexual activity: Not on file  Other Topics Concern   Not on file  Social History Narrative   Not on file   Social Determinants of Health   Financial Resource Strain: Not on file  Food Insecurity: Not on file  Transportation Needs: Not on file  Physical Activity: Not on file  Stress: Not on file  Social Connections: Not on file  Intimate Partner Violence: Not on file    Review of Systems:  All other review of systems negative except as mentioned in the HPI.  Physical Exam: Vital signs in last 24 hours: BP 140/77    Pulse 65    Temp (!) 97.1 F (36.2 C)    Resp 14    Ht 5\' 8"  (1.727 m)    Wt 155 lb (70.3 kg)    SpO2 98%    BMI 23.57 kg/m  General:   Alert, NAD Lungs:  Clear .   Heart:  Regular rate and rhythm Abdomen:  Soft, nontender and nondistended. Neuro/Psych:  Alert and cooperative. Normal mood and affect. A and O x 3  Reviewed labs, radiology imaging, old records and pertinent past GI work up  Patient is appropriate for planned procedure(s) and anesthesia in an ambulatory setting   K. Denzil Magnuson , MD [phone]

## 2021-02-16 NOTE — Patient Instructions (Signed)
YOU HAD AN ENDOSCOPIC PROCEDURE TODAY AT THE [facility]:   Refer to the procedure report that was given to you for any specific questions about what was found during the examination.  If the procedure report does not answer your questions, please call your gastroenterologist to clarify.  If you requested that your care partner not be given the details of your procedure findings, then the procedure report has been included in a sealed envelope for you to review at your convenience later.  YOU SHOULD EXPECT: Some feelings of bloating in the abdomen. Passage of more gas than usual.  Walking can help get rid of the air that was put into your GI tract during the procedure and reduce the bloating. If you had a lower endoscopy (such as a colonoscopy or flexible sigmoidoscopy) you may notice spotting of blood in your stool or on the toilet paper. If you underwent a bowel prep for your procedure, you may not have a normal bowel movement for a few days.  Please Note:  You might notice some irritation and congestion in your nose or some drainage.  This is from the oxygen used during your procedure.  There is no need for concern and it should clear up in a day or so.  SYMPTOMS TO REPORT IMMEDIATELY:   Following lower endoscopy (colonoscopy or flexible sigmoidoscopy):  Excessive amounts of blood in the stool  Significant tenderness or worsening of abdominal pains  Swelling of the abdomen that is new, acute  Fever of 100F or higher  For urgent or emergent issues, a gastroenterologist can be reached at any hour by calling [phone]. Do not use MyChart messaging for urgent concerns.    DIET:  We do recommend a small meal at first, but then you may proceed to your regular diet.  Drink plenty of fluids but you should avoid alcoholic beverages for 24 hours.  ACTIVITY:  You should plan to take it easy for the rest of today and you should NOT DRIVE or use heavy machinery until tomorrow (because  of the sedation medicines used during the test).    FOLLOW UP: Our staff will call the number listed on your records 48-72 hours following your procedure to check on you and address any questions or concerns that you may have regarding the information given to you following your procedure. If we do not reach you, we will leave a message.  We will attempt to reach you two times.  During this call, we will ask if you have developed any symptoms of COVID 19. If you develop any symptoms (ie: fever, flu-like symptoms, shortness of breath, cough etc.) before then, please call [phone].  If you test positive for Covid 19 in the 2 weeks post procedure, please call and report this information to us.    If any biopsies were taken you will be contacted by phone or by letter within the next 1-3 weeks.  Please call us at [phone] if you have not heard about the biopsies in 3 weeks.    SIGNATURES/CONFIDENTIALITY: You and/or your care partner have signed paperwork which will be entered into your electronic medical record.  These signatures attest to the fact that that the information above on your After Visit Summary has been reviewed and is understood.  Full responsibility of the confidentiality of this discharge information lies with you and/or your care-partner. 

## 2021-02-22 ENCOUNTER — Telehealth: Payer: Self-pay | Admitting: *Deleted

## 2021-02-22 NOTE — Telephone Encounter (Signed)
°  Follow up Call-  Call back number 02/16/2021  Post procedure Call Back phone  # [phone]  Permission to leave phone message Yes  Some recent data might be hidden     Patient questions:  Do you have a fever, pain , or abdominal swelling? No. Pain Score  0 *  Have you tolerated food without any problems? Yes.    Have you been able to return to your normal activities? Yes.    Do you have any questions about your discharge instructions: Diet   No. Medications  No. Follow up visit  No.  Do you have questions or concerns about your Care? No.  Actions: * If pain score is 4 or above: No action needed, pain <4.  Have you developed a fever since your procedure? no  2.   Have you had an respiratory symptoms (SOB or cough) since your procedure? no  3.   Have you tested positive for COVID 19 since your procedure no  4.   Have you had any family members/close contacts diagnosed with the COVID 19 since your procedure?  no   If yes to any of these questions please route to Joylene John, RN and Joella Prince, RN

## 2021-03-02 ENCOUNTER — Encounter: Payer: Self-pay | Admitting: Gastroenterology

## 2021-03-20 DIAGNOSIS — E559 Vitamin D deficiency, unspecified: Secondary | ICD-10-CM | POA: Insufficient documentation

## 2021-05-16 ENCOUNTER — Other Ambulatory Visit: Payer: Self-pay

## 2021-05-16 ENCOUNTER — Ambulatory Visit (INDEPENDENT_AMBULATORY_CARE_PROVIDER_SITE_OTHER): Admitting: Family Medicine

## 2021-05-16 VITALS — BP 140/76 | HR 92 | Temp 97.4°F | Ht 68.0 in | Wt 164.8 lb

## 2021-05-16 DIAGNOSIS — J069 Acute upper respiratory infection, unspecified: Secondary | ICD-10-CM | POA: Diagnosis not present

## 2021-05-16 MED ORDER — HYDROCOD POLI-CHLORPHE POLI ER 10-8 MG/5ML PO SUER: 5.0000 mL | Freq: Two times a day (BID) | ORAL | 0 refills | Status: DC | PRN

## 2021-05-16 NOTE — Progress Notes (Signed)
? ?Subjective:  ? ? Patient ID: Marvin Spencer, male    DOB: December 06, 1965, 56 y.o.   MRN: [id number] ? ?Medication Refill ?Associated symptoms include coughing.  ?Cough ?  ? ? ?Patient reports a 2-day history of cough.  He denies any fever or chills.  He denies any rhinorrhea or sinus pain.  He denies any otalgia or headaches.  He denies any postnasal drip.  The cough is nonproductive.  He denies any shortness of breath or chest pain.  However the cough is frequent and aggravating.  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 ?Past Medical History:  ?Diagnosis Date  ? Diabetes mellitus without complication ([place])   ? Hyperlipidemia   ? Hypertriglyceridemia   ? Peyronie disease   ? ?Past Surgical History:  ?Procedure Laterality Date  ? CIRCUMCISION    ? age 53  ? COLONOSCOPY    ? POLYPECTOMY    ? ?Current Outpatient Medications on File Prior to Visit  ?Medication Sig Dispense Refill  ? aspirin EC 81 MG tablet Take 81 mg by mouth daily.    ? Cinnamon 500 MG capsule See admin instructions.    ? COMFORT EZ PEN NEEDLES 31G X 8 MM MISC     ? Continuous Blood Gluc Receiver (FREESTYLE LIBRE 14 DAY READER) [place] See admin instructions.    ? Continuous Blood Gluc Sensor (FREESTYLE LIBRE 14 DAY SENSOR) MISC See admin instructions.    ? glimepiride (AMARYL) 4 MG tablet Take 1 tablet (4 mg total) by mouth 2 (two) times daily. 60 tablet 3  ? glucose blood (ACCU-CHEK GUIDE) test strip See admin instructions.    ? glucose blood (FREESTYLE LITE) test strip Use 3 times daily dx e11.9    ? LANTUS SOLOSTAR 100 UNIT/ML Solostar Pen Inject 45 Units into the skin daily. In the morning    ? metFORMIN (GLUCOPHAGE) 1000 MG tablet Take 1 tablet (1,000 mg total) by mouth 2 (two) times daily with a meal. 60 tablet 3  ? pravastatin (PRAVACHOL) 20 MG tablet Take 20 mg by mouth daily.  0  ? sildenafil (VIAGRA) 100 MG tablet Take 0.5-1 tablets (50-100 mg total) by mouth daily  as needed for erectile dysfunction. 5 tablet 11  ? ?Current Facility-Administered Medications on File Prior to Visit  ?Medication Dose Route Frequency Provider Last Rate Last Admin  ? 0.9 %  sodium chloride infusion  500 mL Intravenous Once Nandigam, Venia Minks, MD      ? ?No Known Allergies ?Social History  ? ?Socioeconomic History  ? Marital status: Married  ?  Spouse name: Not on file  ? Number of children: Not on file  ? Years of education: Not on file  ? Highest education level: Not on file  ?Occupational History  ? Not on file  ?Tobacco Use  ? Smoking status: Never  ? Smokeless tobacco: Never  ?Vaping Use  ? Vaping Use: Never used  ?Substance and Sexual Activity  ? Alcohol use: Yes  ? Drug use: Never  ? Sexual activity: Not on file  ?Other Topics Concern  ? Not on file  ?Social History Narrative  ? Not on file  ? ?Social Determinants of Health  ? ?Financial Resource Strain: Not on file  ?Food Insecurity: Not on file  ?Transportation Needs: Not on file  ?Physical Activity: Not on file  ?Stress: Not on file  ?Social Connections: Not on file  ?Intimate  Partner Violence: Not on file  ? ? ? ? ?Review of Systems  ?Respiratory:  Positive for cough.   ?All other systems reviewed and are negative. ? ?   ?Objective:  ? Physical Exam ?Vitals reviewed.  ?Constitutional:   ?   Appearance: Normal appearance. He is normal weight.  ?HENT:  ?   Right Ear: Tympanic membrane and ear canal normal.  ?   Left Ear: Tympanic membrane and ear canal normal.  ?   Nose: Nose normal. No congestion or rhinorrhea.  ?   Mouth/Throat:  ?   Pharynx: No oropharyngeal exudate or posterior oropharyngeal erythema.  ?Neck:  ?   Vascular: No JVD.  ?Cardiovascular:  ?   Rate and Rhythm: Normal rate and regular rhythm.  ?   Heart sounds: Normal heart sounds. No murmur heard. ?Pulmonary:  ?   Effort: Pulmonary effort is normal. No respiratory distress.  ?   Breath sounds: Normal breath sounds. No stridor. No wheezing, rhonchi or rales.   ?Musculoskeletal:  ?   Cervical back: Neck supple.  ?Neurological:  ?   Mental Status: He is alert.  ? ? ? ? ? ?   ?Assessment & Plan:  ?Viral upper respiratory tract infection ?Based on his exam, I believe the patient likely has a viral upper respiratory infection.  I will treat the patient symptomatically with Tussionex 1 teaspoon every 12 hours as needed for cough.  His symptoms at the present time are mild and I see no evidence of a serious bacterial infection.  Recheck immediately if his symptoms change or worsen. ?

## 2021-08-28 ENCOUNTER — Telehealth: Payer: Self-pay

## 2021-08-28 NOTE — Telephone Encounter (Signed)
Tried to return pt's phone call, however VM was full and unable to LM. Will try again before I leave today.

## 2021-08-29 ENCOUNTER — Other Ambulatory Visit: Payer: Self-pay

## 2021-08-29 NOTE — Telephone Encounter (Signed)
LOV 05/16/21 Last refill 05/16/21, #163m, 0 refills  Per pt still has cough  Please review, thanks!

## 2021-08-30 ENCOUNTER — Telehealth: Payer: Self-pay

## 2021-08-30 NOTE — Telephone Encounter (Signed)
Spoke with pt this morning. Pt c/o why his cough (chlorpheniramine-hydrocodone) med was not refill. Explain to pt that it was denied per Dr. Dennard Schaumann due the length of time. He would need to come in to be evaluated. 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 Advice pt that I don't have any availability until 09/12/21. Suggest pt to go to [facility] if it's something they need now.   Pt voiced understanding and hung up

## 2021-09-22 ENCOUNTER — Ambulatory Visit (INDEPENDENT_AMBULATORY_CARE_PROVIDER_SITE_OTHER): Admitting: Family Medicine

## 2021-09-22 VITALS — BP 118/64 | HR 83 | Temp 98.4°F | Ht 68.0 in | Wt 165.2 lb

## 2021-09-22 DIAGNOSIS — Z125 Encounter for screening for malignant neoplasm of prostate: Secondary | ICD-10-CM

## 2021-09-22 DIAGNOSIS — E1165 Type 2 diabetes mellitus with hyperglycemia: Secondary | ICD-10-CM

## 2021-09-22 DIAGNOSIS — R053 Chronic cough: Secondary | ICD-10-CM

## 2021-09-22 DIAGNOSIS — R413 Other amnesia: Secondary | ICD-10-CM

## 2021-09-22 DIAGNOSIS — Z794 Long term (current) use of insulin: Secondary | ICD-10-CM

## 2021-09-22 NOTE — Progress Notes (Signed)
Subjective:    Patient ID: Marvin Spencer, male    DOB: 1965-03-10, 56 y.o.   MRN: [id number]  HPI   Patient has a history of diabetes mellitus.  He is currently seeing an endocrinologist.  He has a longstanding history of poorly controlled diabetes due to a variety of factors.  His most recent A1c checked yesterday was over 13 suggesting his average blood sugar is well above 300.  He does report polyuria, polydipsia, blurry vision.  His wife is with him today because he has been experiencing memory loss.  She is having to answer the same question multiple times.  He is becoming confused and forgetting passwords to his phones as well as to other devices.  He is forgetting to pay bills or paying the bills from wrong accounts.  He is not getting lost.  He is not forgetting names.  He denies any head injury or seizure-like activity.  He denies any fevers or chills.  He denies any sleep apnea.  He denies any strokelike symptoms.  Past Medical History:  Diagnosis Date   Diabetes mellitus without complication ([place])    Hyperlipidemia    Hypertriglyceridemia    Peyronie disease    Past Surgical History:  Procedure Laterality Date   CIRCUMCISION     age 2   COLONOSCOPY     POLYPECTOMY     Current Outpatient Medications on File Prior to Visit  Medication Sig Dispense Refill   aspirin EC 81 MG tablet Take 81 mg by mouth daily.     COMFORT EZ PEN NEEDLES 31G X 8 MM MISC      Continuous Blood Gluc Receiver (FREESTYLE LIBRE 14 DAY READER) [place] See admin instructions.     Continuous Blood Gluc Sensor (FREESTYLE LIBRE 14 DAY SENSOR) MISC See admin instructions.     glimepiride (AMARYL) 4 MG tablet Take 1 tablet (4 mg total) by mouth 2 (two) times daily. 60 tablet 3   glucose blood (ACCU-CHEK GUIDE) test strip See admin instructions.     glucose blood (FREESTYLE LITE) test strip Use 3 times daily dx e11.9     LANTUS SOLOSTAR 100 UNIT/ML Solostar Pen Inject 45 Units into the skin daily. In the  morning     metFORMIN (GLUCOPHAGE) 1000 MG tablet Take 1 tablet (1,000 mg total) by mouth 2 (two) times daily with a meal. 60 tablet 3   pravastatin (PRAVACHOL) 20 MG tablet Take 20 mg by mouth daily.  0   chlorpheniramine-HYDROcodone (TUSSIONEX PENNKINETIC ER) 10-8 MG/5ML Take 5 mLs by mouth every 12 (twelve) hours as needed for cough. (Patient not taking: Reported on 09/22/2021) 115 mL 0   Cinnamon 500 MG capsule See admin instructions. (Patient not taking: Reported on 09/22/2021)     sildenafil (VIAGRA) 100 MG tablet Take 0.5-1 tablets (50-100 mg total) by mouth daily as needed for erectile dysfunction. (Patient not taking: Reported on 09/22/2021) 5 tablet 11   Current Facility-Administered Medications on File Prior to Visit  Medication Dose Route Frequency Provider Last Rate Last Admin   0.9 %  sodium chloride infusion  500 mL Intravenous Once Nandigam, Venia Minks, MD       No Known Allergies Social History   Socioeconomic History   Marital status: Married    Spouse name: Not on file   Number of children: Not on file   Years of education: Not on file   Highest education level: Not on file  Occupational History   Not on file  Tobacco Use   Smoking status: Never   Smokeless tobacco: Never  Vaping Use   Vaping Use: Never used  Substance and Sexual Activity   Alcohol use: Yes   Drug use: Never   Sexual activity: Not on file  Other Topics Concern   Not on file  Social History Narrative   Not on file   Social Determinants of Health   Financial Resource Strain: Not on file  Food Insecurity: Not on file  Transportation Needs: Not on file  Physical Activity: Not on file  Stress: Not on file  Social Connections: Not on file  Intimate Partner Violence: Not on file      Review of Systems  All other systems reviewed and are negative.      Objective:   Physical Exam Vitals reviewed.  Neck:     Vascular: No JVD.  Cardiovascular:     Rate and Rhythm: Normal rate and regular  rhythm.     Heart sounds: Normal heart sounds. No murmur heard. Pulmonary:     Effort: Pulmonary effort is normal. No respiratory distress.     Breath sounds: Normal breath sounds. No wheezing or rales.  Abdominal:     General: Bowel sounds are normal.     Palpations: Abdomen is soft.  Musculoskeletal:     Cervical back: Neck supple.           Assessment & Plan:  Chronic cough - Plan: DG Chest 2 View  Prostate cancer screening - Plan: PSA  Type 2 diabetes mellitus with hyperglycemia, with long-term current use of insulin (HCC) - Plan: CBC with Differential/Platelet, COMPLETE METABOLIC PANEL WITH GFR  Memory loss - Plan: TSH, Vitamin B12 During review of systems he also reports a chronic cough.  Therefore instead of a physical exam today we primarily are focusing on medical issues.  Given the chronic cough I will obtain a chest x-ray.  I suspect the patient likely has a chronic cough due to laryngal esophageal reflux.  I will screen for prostate cancer with a PSA.  I will check a CBC and a CMP to monitor his electrolytes.  Given the memory loss I will check a TSH and vitamin B12.  I suspect the memory loss may be due to poorly controlled blood sugars however the patient is high risk for TIA and stroke given his longstanding history of diabetes.  Therefore I will obtain an MRI.  His neurologic exam today however is normal

## 2021-09-23 LAB — CBC WITH DIFFERENTIAL/PLATELET
Absolute Monocytes: 553 cells/uL (ref 200–950)
Basophils Absolute: 39 cells/uL (ref 0–200)
Basophils Relative: 0.6 %
Eosinophils Absolute: 78 cells/uL (ref 15–500)
Eosinophils Relative: 1.2 %
HCT: 45.3 % (ref 38.5–50.0)
Hemoglobin: 14.9 g/dL (ref 13.2–17.1)
Lymphs Abs: 1944 cells/uL (ref 850–3900)
MCH: 28.3 pg (ref 27.0–33.0)
MCHC: 32.9 g/dL (ref 32.0–36.0)
MCV: 86.1 fL (ref 80.0–100.0)
MPV: 10.5 fL (ref 7.5–12.5)
Monocytes Relative: 8.5 %
Neutro Abs: 3887 cells/uL (ref 1500–7800)
Neutrophils Relative %: 59.8 %
Platelets: 260 10*3/uL (ref 140–400)
RBC: 5.26 10*6/uL (ref 4.20–5.80)
RDW: 11.8 % (ref 11.0–15.0)
Total Lymphocyte: 29.9 %
WBC: 6.5 10*3/uL (ref 3.8–10.8)

## 2021-09-23 LAB — TSH: TSH: 1.11 mIU/L (ref 0.40–4.50)

## 2021-09-23 LAB — COMPLETE METABOLIC PANEL WITH GFR
AG Ratio: 1.5 (calc) (ref 1.0–2.5)
ALT: 13 U/L (ref 9–46)
AST: 15 U/L (ref 10–35)
Albumin: 4.3 g/dL (ref 3.6–5.1)
Alkaline phosphatase (APISO): 90 U/L (ref 35–144)
BUN: 16 mg/dL (ref 7–25)
CO2: 27 mmol/L (ref 20–32)
Calcium: 9.1 mg/dL (ref 8.6–10.3)
Chloride: 103 mmol/L (ref 98–110)
Creat: 0.94 mg/dL (ref 0.70–1.30)
Globulin: 2.9 g/dL (calc) (ref 1.9–3.7)
Glucose, Bld: 271 mg/dL — ABNORMAL HIGH (ref 65–99)
Potassium: 4.6 mmol/L (ref 3.5–5.3)
Sodium: 137 mmol/L (ref 135–146)
Total Bilirubin: 0.4 mg/dL (ref 0.2–1.2)
Total Protein: 7.2 g/dL (ref 6.1–8.1)
eGFR: 96 mL/min/{1.73_m2} (ref >=60)

## 2021-09-23 LAB — PSA: PSA: 0.53 ng/mL (ref <=4.00)

## 2021-09-23 LAB — VITAMIN B12: Vitamin B-12: 481 pg/mL (ref 200–1100)

## 2021-09-26 ENCOUNTER — Ambulatory Visit
Admission: RE | Admit: 2021-09-26 | Discharge: 2021-09-26 | Disposition: A | Discharge status: Home / Self Care | Source: Ambulatory Visit | Attending: Family Medicine | Admitting: Family Medicine

## 2021-09-26 ENCOUNTER — Other Ambulatory Visit: Payer: Self-pay

## 2021-09-26 DIAGNOSIS — R053 Chronic cough: Secondary | ICD-10-CM

## 2021-10-02 ENCOUNTER — Other Ambulatory Visit: Payer: Self-pay

## 2021-10-02 MED ORDER — PANTOPRAZOLE SODIUM 40 MG PO TBEC: 40.0000 mg | DELAYED_RELEASE_TABLET | Freq: Every day | ORAL | 3 refills | Status: AC

## 2021-10-30 ENCOUNTER — Encounter: Payer: Self-pay | Admitting: Family Medicine

## 2021-10-31 ENCOUNTER — Telehealth: Payer: Self-pay | Admitting: Family Medicine

## 2021-10-31 NOTE — Telephone Encounter (Signed)
Patient's wife returned missed call. Stated they don't answer the phone until after 3pm. Requesting for caller to leave a detailed message if unable to wait until after 3pm to call back.

## 2021-11-16 ENCOUNTER — Ambulatory Visit: Payer: Self-pay | Admitting: *Deleted

## 2021-11-16 NOTE — Telephone Encounter (Signed)
  Chief Complaint: right shoulder pain Symptoms: right shoulder pain , popping when rotating right shoulder. Worsening pain noted after picking up something and jerked Frequency: 3-4 months ago  Pertinent Negatives: Patient denies chest pain no difficulty breathing no fever, no swelling no rash  Disposition: '[]'$ ED /'[]'$ Urgent Care (no appt availability in office) / '[x]'$ Appointment(In office/virtual)/ '[]'$  [facility]/ '[]'$ Home Care/ '[]'$ Refused Recommended Disposition /'[]'$ [facility]/ '[]'$  Follow-up with PCP Additional Notes:   Recommended ibuprofen as directed if needed for pain.  Appt scheduled for 11/21/21. Patient requesting appt Monday 11/20/21 if possible due to being off of work and will need to take off work for appt Tuesday 11/21/21. Please advise .      Reason for Disposition  [1] MILD pain (e.g., does not interfere with normal activities) AND [2] present > 7 days  Answer Assessment - Initial Assessment Questions 1. ONSET: "When did the pain start?"     3-4 months ago  2. LOCATION: "Where is the pain located?"     Right front side of shoulder  3. PAIN: "How bad is the pain?" (Scale 1-10; or mild, moderate, severe)   - MILD (1-3): doesn't interfere with normal activities   - MODERATE (4-7): interferes with normal activities (e.g., work or school) or awakens from sleep   - SEVERE (8-10): excruciating pain, unable to do any normal activities, unable to move arm at all due to pain     Popping pain during rotation of shoulder  4. WORK OR EXERCISE: "Has there been any recent work or exercise that involved this part of the body?"     Lifting something and made quick jerk  5. CAUSE: "What do you think is causing the shoulder pain?"     Not sure  6. OTHER SYMPTOMS: "Do you have any other symptoms?" (e.g., neck pain, swelling, rash, fever, numbness, weakness)     Right shoulder pain, popping with rotation 7. PREGNANCY: "Is there any chance you are pregnant?" "When was your last  menstrual period?"     na  Protocols used: Shoulder Pain-A-AH

## 2021-11-20 ENCOUNTER — Ambulatory Visit: Admitting: Family Medicine

## 2021-11-20 ENCOUNTER — Encounter: Payer: Self-pay | Admitting: Family Medicine

## 2021-11-20 VITALS — BP 128/72 | HR 83 | Ht 68.0 in | Wt 166.0 lb

## 2021-11-20 DIAGNOSIS — Z794 Long term (current) use of insulin: Secondary | ICD-10-CM | POA: Diagnosis not present

## 2021-11-20 DIAGNOSIS — M25511 Pain in right shoulder: Secondary | ICD-10-CM

## 2021-11-20 DIAGNOSIS — E1165 Type 2 diabetes mellitus with hyperglycemia: Secondary | ICD-10-CM | POA: Diagnosis not present

## 2021-11-20 MED ORDER — MELOXICAM 15 MG PO TABS: 15.0000 mg | ORAL_TABLET | Freq: Every day | ORAL | 3 refills | Status: DC

## 2021-11-20 NOTE — Progress Notes (Signed)
Subjective:    Patient ID: Marvin Spencer, male    DOB: 09-05-1965, 56 y.o.   MRN: [id number]  HPI  Patient presents today with bilateral shoulder pain right greater than left.  Patient reports crepitus with range of motion.  The pain is located in the anterior portion of the shoulder near the [facility] joint.  He states the pain began when he made a violent jerking motion with his right arm trying to pull something.  He has no pain with abduction greater than 120 degrees.  He has no pain with internal or external rotation.  He has a negative empty can sign.  He has negative Hawkins sign.  He has a negative drop test.  He does have some crepitus with passive range of motion.  Examination of the right and left shoulder reveals similar findings.  The pain is very mild and vague in nature and primarily consist of crepitus  Past Medical History:  Diagnosis Date   Diabetes mellitus without complication ([place])    Hyperlipidemia    Hypertriglyceridemia    Peyronie disease    Past Surgical History:  Procedure Laterality Date   CIRCUMCISION     age 67   COLONOSCOPY     POLYPECTOMY     Current Outpatient Medications on File Prior to Visit  Medication Sig Dispense Refill   aspirin EC 81 MG tablet Take 81 mg by mouth daily.     chlorpheniramine-HYDROcodone (TUSSIONEX PENNKINETIC ER) 10-8 MG/5ML Take 5 mLs by mouth every 12 (twelve) hours as needed for cough. (Patient not taking: Reported on 09/22/2021) 115 mL 0   Cinnamon 500 MG capsule See admin instructions. (Patient not taking: Reported on 09/22/2021)     COMFORT EZ PEN NEEDLES 31G X 8 MM MISC      Continuous Blood Gluc Receiver (FREESTYLE LIBRE 47 DAY READER) DEVI See admin instructions.     Continuous Blood Gluc Sensor (FREESTYLE LIBRE 14 DAY SENSOR) MISC See admin instructions.     glimepiride (AMARYL) 4 MG tablet Take 1 tablet (4 mg total) by mouth 2 (two) times daily. 60 tablet 3   glucose blood (ACCU-CHEK GUIDE) test strip See admin instructions.      glucose blood (FREESTYLE LITE) test strip Use 3 times daily dx e11.9     LANTUS SOLOSTAR 100 UNIT/ML Solostar Pen Inject 45 Units into the skin daily. In the morning     metFORMIN (GLUCOPHAGE) 1000 MG tablet Take 1 tablet (1,000 mg total) by mouth 2 (two) times daily with a meal. 60 tablet 3   pantoprazole (PROTONIX) 40 MG tablet Take 1 tablet (40 mg total) by mouth daily. 30 tablet 3   pravastatin (PRAVACHOL) 20 MG tablet Take 20 mg by mouth daily.  0   sildenafil (VIAGRA) 100 MG tablet Take 0.5-1 tablets (50-100 mg total) by mouth daily as needed for erectile dysfunction. (Patient not taking: Reported on 09/22/2021) 5 tablet 11   Current Facility-Administered Medications on File Prior to Visit  Medication Dose Route Frequency Provider Last Rate Last Admin   0.9 %  sodium chloride infusion  500 mL Intravenous Once Nandigam, Venia Minks, MD       No Known Allergies Social History   Socioeconomic History   Marital status: Married    Spouse name: Not on file   Number of children: Not on file   Years of education: Not on file   Highest education level: Not on file  Occupational History   Not on file  Tobacco Use   Smoking status: Never   Smokeless tobacco: Never  Vaping Use   Vaping Use: Never used  Substance and Sexual Activity   Alcohol use: Yes   Drug use: Never   Sexual activity: Not on file  Other Topics Concern   Not on file  Social History Narrative   Not on file   Social Determinants of Health   Financial Resource Strain: Not on file  Food Insecurity: Not on file  Transportation Needs: Not on file  Physical Activity: Not on file  Stress: Not on file  Social Connections: Not on file  Intimate Partner Violence: Not on file      Review of Systems  All other systems reviewed and are negative.      Objective:   Physical Exam Vitals reviewed.  Neck:     Vascular: No JVD.  Cardiovascular:     Rate and Rhythm: Normal rate and regular rhythm.     Heart sounds:  Normal heart sounds. No murmur heard. Pulmonary:     Effort: Pulmonary effort is normal. No respiratory distress.     Breath sounds: Normal breath sounds. No wheezing or rales.  Abdominal:     General: Bowel sounds are normal.     Palpations: Abdomen is soft.  Musculoskeletal:     Right shoulder: Crepitus present. No swelling, deformity, effusion, tenderness or bony tenderness. Normal range of motion. Normal strength.     Left shoulder: Crepitus present. No swelling, deformity, effusion, tenderness or bony tenderness. Normal range of motion. Normal strength.     Cervical back: Neck supple.           Assessment & Plan:  Acute pain of right shoulder - Plan: DG Shoulder Right, meloxicam (MOBIC) 15 MG tablet  Type 2 diabetes mellitus with hyperglycemia, with long-term current use of insulin (HCC) - Plan: Hemoglobin A1c, COMPLETE METABOLIC PANEL WITH GFR Patient's exam today is nonfocal.  I suspect that his bilateral shoulder pain could be due to osteoarthritis.  I recommended getting an x-ray of his right shoulder to evaluate further.  Meanwhile we will start the patient on meloxicam 15 mg daily.  There is no obvious signs of rotator cuff tear.  There is no sign of impingement syndrome.  Patient is not checking his sugars.  Therefore I would like to get an A1c with a CMP to monitor his electrolytes to determine first if he can tolerate an NSAID on a regular basis and second to determine if he has adequate glycemic control to tolerate a cortisone injection if necessary

## 2021-11-21 ENCOUNTER — Ambulatory Visit: Admitting: Family Medicine

## 2021-11-21 ENCOUNTER — Ambulatory Visit
Admission: RE | Admit: 2021-11-21 | Discharge: 2021-11-21 | Disposition: A | Discharge status: Home / Self Care | Source: Ambulatory Visit | Attending: Family Medicine | Admitting: Family Medicine

## 2021-11-21 ENCOUNTER — Telehealth: Payer: Self-pay

## 2021-11-21 ENCOUNTER — Ambulatory Visit
Admission: RE | Admit: 2021-11-21 | Discharge: 2021-11-21 | Disposition: A | Discharge status: Home / Self Care | Attending: Family Medicine | Admitting: Family Medicine

## 2021-11-21 DIAGNOSIS — M25511 Pain in right shoulder: Secondary | ICD-10-CM | POA: Diagnosis present

## 2021-11-21 LAB — COMPLETE METABOLIC PANEL WITH GFR
AG Ratio: 1.2 (calc) (ref 1.0–2.5)
ALT: 11 U/L (ref 9–46)
AST: 12 U/L (ref 10–35)
Albumin: 4 g/dL (ref 3.6–5.1)
Alkaline phosphatase (APISO): 92 U/L (ref 35–144)
BUN: 18 mg/dL (ref 7–25)
CO2: 25 mmol/L (ref 20–32)
Calcium: 9 mg/dL (ref 8.6–10.3)
Chloride: 101 mmol/L (ref 98–110)
Creat: 0.97 mg/dL (ref 0.70–1.30)
Globulin: 3.4 g/dL (calc) (ref 1.9–3.7)
Glucose, Bld: 416 mg/dL — ABNORMAL HIGH (ref 65–99)
Potassium: 4.4 mmol/L (ref 3.5–5.3)
Sodium: 136 mmol/L (ref 135–146)
Total Bilirubin: 0.3 mg/dL (ref 0.2–1.2)
Total Protein: 7.4 g/dL (ref 6.1–8.1)
eGFR: 92 mL/min/{1.73_m2} (ref >=60)

## 2021-11-21 LAB — HEMOGLOBIN A1C
Hgb A1c MFr Bld: 10.7 % of total Hgb — ABNORMAL HIGH (ref <5.7)
Mean Plasma Glucose: 260 mg/dL
eAG (mmol/L): 14.4 mmol/L

## 2021-11-21 NOTE — Telephone Encounter (Signed)
Pt's wife called in wanting to discuss pt's lab  results. Please advise.  Cb#: [phone]

## 2022-04-24 ENCOUNTER — Encounter: Payer: Self-pay | Admitting: Family Medicine

## 2022-07-11 ENCOUNTER — Other Ambulatory Visit: Payer: Self-pay

## 2022-07-11 ENCOUNTER — Emergency Department
Admission: EM | Admit: 2022-07-11 | Discharge: 2022-07-11 | Disposition: A | Discharge status: Home / Self Care | Attending: Emergency Medicine | Admitting: Emergency Medicine

## 2022-07-11 DIAGNOSIS — L03012 Cellulitis of left finger: Secondary | ICD-10-CM | POA: Insufficient documentation

## 2022-07-11 DIAGNOSIS — E119 Type 2 diabetes mellitus without complications: Secondary | ICD-10-CM | POA: Diagnosis not present

## 2022-07-11 DIAGNOSIS — Z794 Long term (current) use of insulin: Secondary | ICD-10-CM | POA: Diagnosis not present

## 2022-07-11 DIAGNOSIS — M79645 Pain in left finger(s): Secondary | ICD-10-CM | POA: Diagnosis present

## 2022-07-11 MED ORDER — AMOXICILLIN-POT CLAVULANATE 875-125 MG PO TABS: 1.0000 | ORAL_TABLET | Freq: Two times a day (BID) | ORAL | 0 refills | Status: AC

## 2022-07-11 NOTE — ED Triage Notes (Signed)
Pt to ED for swelling to left middle finger noticed today. Pt also reports some green discoloration to nailbed.

## 2022-07-11 NOTE — ED Provider Triage Note (Signed)
Emergency Medicine Provider Triage Evaluation Note  Marvin Spencer , a 57 y.o. male  was evaluated in triage.  Pt complains of left 3rd finger pain that began today.   Review of Systems  Positive: Finger pain Negative: fever  Physical Exam  Pulse 83   Temp 98.4 F (36.9 C)   Resp 18   SpO2 95%  Gen:   Awake, no distress   Resp:  Normal effort  MSK:   Moves extremities without difficulty  Other:  L 3rd finger paronychia  Medical Decision Making  Medically screening exam initiated at 6:35 PM.  Appropriate orders placed.  GEREMIAS FELTON was informed that the remainder of the evaluation will be completed by another provider, this initial triage assessment does not replace that evaluation, and the importance of remaining in the ED until their evaluation is complete.     Jackelyn Hoehn, PA-C 07/11/22 1836

## 2022-07-11 NOTE — ED Provider Notes (Signed)
[facility] Provider Note    Event Date/Time   First MD Initiated Contact with Patient 07/11/22 2002     (approximate)   History   finger swelling   HPI  Marvin Spencer is a 57 y.o. male with a past medical history of insulin-dependent diabetes who presents today for evaluation of left third finger pain.  Patient reports that this occurred this morning.  No known trauma.  No fevers or chills.  No drainage.  Patient Active Problem List   Diagnosis Date Noted   Vitamin D deficiency 03/20/2021   Type 2 diabetes mellitus with hyperglycemia, with long-term current use of insulin (HCC) 06/27/2019   Hyperlipidemia associated with type 2 diabetes mellitus (HCC) 06/27/2019   Peyronie disease    Hypertriglyceridemia    Diabetes mellitus without complication [facility])           Physical Exam   Triage Vital Signs: ED Triage Vitals  Enc Vitals Group     BP 07/11/22 1835 (!) 150/106     Pulse Rate 07/11/22 1834 83     Resp 07/11/22 1834 18     Temp 07/11/22 1834 98.4 F (36.9 C)     Temp src --      SpO2 07/11/22 1834 95 %     Weight 07/11/22 1834 130 lb (59 kg)     Height 07/11/22 1834 5\' 8"  (1.727 m)     Head Circumference --      Peak Flow --      Pain Score 07/11/22 1834 0     Pain Loc --      Pain Edu? --      Excl. in GC? --     Most recent vital signs: Vitals:   07/11/22 1834 07/11/22 1835  BP:  (!) 150/106  Pulse: 83   Resp: 18   Temp: 98.4 F (36.9 C)   SpO2: 95%     Physical Exam Vitals and nursing note reviewed.  Constitutional:      General: Awake and alert. No acute distress.    Appearance: Normal appearance. The patient is normal weight.  HENT:     Head: Normocephalic and atraumatic.     Mouth: Mucous membranes are moist.  Eyes:     General: PERRL. Normal EOMs        Right eye: No discharge.        Left eye: No discharge.     Conjunctiva/sclera: Conjunctivae normal.  Cardiovascular:     Rate and Rhythm: Normal rate  and regular rhythm.     Pulses: Normal pulses.  Pulmonary:     Effort: Pulmonary effort is normal. No respiratory distress.     Breath sounds: Normal breath sounds.  Abdominal:     Abdomen is soft. There is no abdominal tenderness. No rebound or guarding. No distention. Musculoskeletal:        General: No swelling. Normal range of motion.     Cervical back: Normal range of motion and neck supple.  Left hand: Third finger with paronychia.  No felon.  No tenderness along the flexor tendon sheath, no fusiform swelling.  No discharge or drainage.  No swelling of the rest of the digit.  Normal appearing fingernail. Skin:    General: Skin is warm and dry.     Capillary Refill: Capillary refill takes less than 2 seconds.     Findings: No rash.  Neurological:     Mental Status: The patient is awake and alert.  ED Results / Procedures / Treatments   Labs (all labs ordered are listed, but only abnormal results are displayed) Labs Reviewed - No data to display   EKG     RADIOLOGY     PROCEDURES:  Critical Care performed:   Marland KitchenMarland KitchenIncision and Drainage  Date/Time: 07/11/2022 11:30 PM  Performed by: Jackelyn Hoehn, PA-C Authorized by: Jackelyn Hoehn, PA-C   Consent:    Consent obtained:  Verbal   Consent given by:  Patient   Risks, benefits, and alternatives were discussed: yes     Risks discussed:  Bleeding, damage to other organs, incomplete drainage, infection and pain   Alternatives discussed:  No treatment Universal protocol:    Procedure explained and questions answered to patient or proxy's satisfaction: yes     Relevant documents present and verified: yes     Imaging studies available: yes     Required blood products, implants, devices, and special equipment available: yes     Site/side marked: yes     Immediately prior to procedure, a time out was called: yes     Patient identity confirmed:  Verbally with patient Location:    Type:  Abscess   Location:  Upper  extremity   Upper extremity location:  Finger   Finger location:  L long finger Pre-procedure details:    Skin preparation:  Antiseptic wash Sedation:    Sedation type:  None Anesthesia:    Anesthesia method:  None Procedure type:    Complexity:  Simple Procedure details:    Ultrasound guidance: no     Needle aspiration: no     Incision types:  Single straight   Wound management:  Probed and deloculated and irrigated with saline   Drainage:  Purulent   Drainage amount:  Moderate   Wound treatment:  Wound left open   Packing materials:  None Post-procedure details:    Procedure completion:  Tolerated well, no immediate complications    MEDICATIONS ORDERED IN ED: Medications - No data to display   IMPRESSION / MDM / ASSESSMENT AND PLAN / ED COURSE  I reviewed the triage vital signs and the nursing notes.   Differential diagnosis includes, but is not limited to, paronychia, felon, cellulitis.  Patient is awake and alert, hemodynamically stable and afebrile.  His exam is consistent with a paronychia.  He agreed to I&D.  His finger was cleaned with iodine, and cuticle was lifted and the purulence was released.  There is no evidence of felon.  No palmar abnormalities.  He was started on a prophylactic antibiotic given his history of diabetes.  Discussed the importance of keeping this area clean with soap and water.  Discussed return precautions and the importance of close outpatient follow-up.  Patient or stands and agrees with plan.  He was discharged in stable condition.   Patient's presentation is most consistent with acute complicated illness / injury requiring diagnostic workup.   FINAL CLINICAL IMPRESSION(S) / ED DIAGNOSES   Final diagnoses:  Paronychia of finger, left     Rx / DC Orders   ED Discharge Orders          Ordered    amoxicillin-clavulanate (AUGMENTIN) 875-125 MG tablet  2 times daily        07/11/22 2009             Note:  This document was  prepared using Dragon voice recognition software and may include unintentional dictation errors.   Jackelyn Hoehn, PA-C 07/11/22 2331  Merwyn Katos, MD 07/12/22 2322

## 2022-07-11 NOTE — Discharge Instructions (Addendum)
Keep your finger clean with soap and water.  You may take the antibiotics as prescribed.  Please return for any new, worsening, or change in symptoms or other concerns.  It was a pleasure caring for you today.

## 2022-07-12 ENCOUNTER — Telehealth: Payer: Self-pay

## 2022-07-12 NOTE — Transitions of Care (Post Inpatient/ED Visit) (Signed)
   07/25/2022  Name: Marvin Spencer MRN: [id number] DOB: 06-30-1965  Today's TOC FU Call Status: Today's TOC FU Call Status:: Unsuccessful Call (3rd Attempt) Unsuccessful Call (2nd Attempt) Date: 07/19/22 Unsuccessful Call (3rd Attempt) Date: 07/25/22  Attempted to reach the patient regarding the most recent Inpatient/ED visit.  Follow Up Plan: No further outreach attempts will be made at this time. We have been unable to contact the patient.  Signature  Fredirick Maudlin

## 2022-08-01 ENCOUNTER — Telehealth: Payer: Self-pay

## 2022-08-01 NOTE — Telephone Encounter (Signed)
Patient wife called because she states that the patient has a appointment with our office today but the appointment is not on mychart. Informed patient wife that she is not on patient DPR and I would need to talk to the patient to get authorization to be able to give information to her due to HIPAA. The patient got on the phone and gave me authorization to talk to his wife. Informed her that he has never been seen at our office before and has had colonoscopy through [facility] and not through our office. I informed her we would need a referral from a provider referring him to our office. She states she will call his PCP

## 2022-08-03 ENCOUNTER — Ambulatory Visit: Attending: Internal Medicine | Admitting: Occupational Therapy

## 2022-08-03 ENCOUNTER — Encounter: Payer: Self-pay | Admitting: Occupational Therapy

## 2022-08-03 DIAGNOSIS — R41840 Attention and concentration deficit: Secondary | ICD-10-CM | POA: Diagnosis present

## 2022-08-03 DIAGNOSIS — R41842 Visuospatial deficit: Secondary | ICD-10-CM | POA: Insufficient documentation

## 2022-08-03 DIAGNOSIS — R29818 Other symptoms and signs involving the nervous system: Secondary | ICD-10-CM | POA: Diagnosis present

## 2022-08-03 DIAGNOSIS — M6281 Muscle weakness (generalized): Secondary | ICD-10-CM | POA: Insufficient documentation

## 2022-08-03 DIAGNOSIS — R278 Other lack of coordination: Secondary | ICD-10-CM | POA: Insufficient documentation

## 2022-08-03 NOTE — Therapy (Signed)
OUTPATIENT OCCUPATIONAL THERAPY NEURO EVALUATION  Patient Name: Marvin Spencer MRN: [id number] DOB:17-Aug-1965, 57 y.o., male Today's Date: 08/03/2022  PCP: Marvin Brooks, MD  REFERRING PROVIDER: Arman Spencer, Marvin Jones, MD  END OF SESSION:  OT End of Session - 08/03/22 0931     Visit Number 1    Number of Visits 13    Date for OT Re-Evaluation 09/14/22    Authorization Type [employer]    Authorization - Visit Number 15    OT Start Time 0934    OT Stop Time 1016    OT Time Calculation (min) 42 min    Activity Tolerance Patient tolerated treatment well    Behavior During Therapy WFL for tasks assessed/performed             Past Medical History:  Diagnosis Date   Diabetes mellitus without complication (HCC)    Hyperlipidemia    Hypertriglyceridemia    Peyronie disease    Past Surgical History:  Procedure Laterality Date   CIRCUMCISION     age 28   COLONOSCOPY     POLYPECTOMY     Patient Active Problem List   Diagnosis Date Noted   Vitamin D deficiency 03/20/2021   Type 2 diabetes mellitus with hyperglycemia, with long-term current use of insulin (HCC) 06/27/2019   Hyperlipidemia associated with type 2 diabetes mellitus (HCC) 06/27/2019   Peyronie disease    Hypertriglyceridemia    Diabetes mellitus without complication (HCC)     ONSET DATE: Date of referral - 06/07/2022  REFERRING DIAG: G30.8 (ICD-10-CM) - Other Alzheimer's disease  THERAPY DIAG:  Other symptoms and signs involving the nervous system  Other lack of coordination  Muscle weakness (generalized)  Rationale for Evaluation and Treatment: Rehabilitation  SUBJECTIVE:   SUBJECTIVE STATEMENT: Has had difficulty with memory and overall functional decline for 6 months to a year. Sings at church and is not confident with steps like those needed to get to the pulpit. His wife is a Midwife. He has a recipe for key lime cake that was passed down to him from his mother and his  daughter is now able to make. He likes to make [place] burgers but does not have this recipe written down. He is afraid that when he uses the stove that he will forget to do something like turn off the stove.   Pt accompanied by: significant other - Marvin Spencer  PERTINENT HISTORY: Pt referred to OP OT secondary to complications from Alzheimer's from [place].   PCP note states,"This 57 year old MALE patient is here for a followup coordination seems off with hand eye like picking up fork. Short term memory worsening. Concerns depth perception. Increased anxiety worsening depression. Making finacial mistake hasn't paid mortgage 3 months was paying tution instead. Working daily daughter driving. 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 He has seen neurology here at the [facility] and is willing to see neuropsychology. He has been diagnosed with dementia   no medications have been started. He declined lumbar puncture due to concerns of being paralyzed after the lumbar puncture  Issues with vision started about 1 year ago follow by short term memory loss.  States he no longer uses stove or oven and has confusion on using microwave.  His mood has declined due to his awareness of his progressive vision and memory  issues. "  PRECAUTIONS: Fall  WEIGHT BEARING RESTRICTIONS: No  PAIN:  Are you having pain? No; some B knee pain  FALLS: Has patient fallen in last 6 months? 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   OBJECTIVE:   HAND DOMINANCE:  Right  ADLs: Overall ADLs: distance supervision  UB Dressing: min A to get L arm through; difficulty with sports coat for church   IADLs:  Light housekeeping: Trash, cleaning, change batteries in doors for alarm, push mow yard Meal Prep: has a culinary degree, make key lime cake, ground [place] burgers Community mobility: dependently Medication management: supervision to min A Handwriting: Increased time and unable to write on the line for a signature  MOBILITY STATUS: Independent and lacks confidence with stairs; overall wife reports he  walks "gingerly"  ACTIVITY TOLERANCE: Activity tolerance: good  FUNCTIONAL OUTCOME MEASURES: To be assessed during upcoming visit  UPPER EXTREMITY ROM:     BUE - WNL L shoulder flexion to 90* then has pain for remaining range  UPPER EXTREMITY MMT:     BUE - WNL  HAND FUNCTION: Grip strength: Right: 63.2 lbs; Left: 76.4 lbs  COORDINATION: 9 Hole Peg test: Right: 45 sec; Left: 42 sec - required cues for sequencing  SENSATION: WFL  EDEMA: none reported or observed  MUSCLE TONE: WNL  COGNITION: Overall cognitive status: Impaired  VISION: Subjective report: Has difficulty processing what he is seeing Baseline vision: Wears glasses for reading only   VISION ASSESSMENT: Impaired Unable to read text on badge buddy; unable to read clock on wall, but able to read titles of posters on the wall.   PERCEPTION: Impaired: Spatial orientation: better with demonstration and repetition  PRAXIS: Impaired: Initiation and Motor planning  OBSERVATIONS: Pt appears well-kept. No AD, no LOB with ambulation.   TODAY'S TREATMENT:                                                                                                                               N/A  PATIENT EDUCATION: Education details: OT role and POC; referral recommendations Person educated: Patient and Spouse Education method: Explanation Education comprehension: verbalized  understanding and needs further education  HOME EXERCISE PROGRAM: N/A this visit  GOALS:  SHORT TERM GOALS: Target date: 08/31/2022    Patient will demonstrate simple UE ROM HEP with 25% verbal cues or less for proper execution. Baseline: Goal status: INITIAL  2.  Patient's caregiver will independently recall at least 2 compensatory strategies for visual impairment without cueing.  Baseline:  Goal status: INITIAL  3.  Patient's caregiver will verbalize understanding of activities to promote visual  scanning.  Baseline:  Goal status: INITIAL   LONG TERM GOALS: Target date: 09/14/2022    Patient will demonstrate at least 68.2 lbs R grip strength as needed to open jars and other containers. Baseline: 63.2 lbs Goal status: INITIAL  2.  Will set appropriate outcome measure based goal. Baseline:  Goal status: INITIAL  3.  Patient will complete nine-hole peg with use of each hand in 35 seconds or less. Baseline: Right: 45 sec; Left: 42 sec  Goal status: INITIAL  4.  Pt will complete UB dressing mod I with use of adaptive strategies such as sequencing chart as needed.  Baseline: min A to thread LUE Goal status: INITIAL   ASSESSMENT:  CLINICAL IMPRESSION: Patient is a 57 y.o. male who was seen today for occupational therapy evaluation secondary to Alzheimer's diagnosis. Hx includes DM and HLD. Patient currently presents below baseline level of functioning demonstrating functional deficits and impairments as noted below. Pt would benefit from skilled OT services in the outpatient setting to work on impairments as noted below to help pt return to PLOF as able.    PERFORMANCE DEFICITS: in functional skills including ADLs, IADLs, coordination, ROM, strength, pain, Fine motor control, Gross motor control, mobility, vision, and UE functional use, cognitive skills including attention, energy/drive, memory, orientation, perception, problem solving, sequencing, and  understand.  IMPAIRMENTS: are limiting patient from ADLs, IADLs, rest and sleep, work, leisure, and social participation.   CO-MORBIDITIES: may have co-morbidities  that affects occupational performance. Patient will benefit from skilled OT to address above impairments and improve overall function.  MODIFICATION OR ASSISTANCE TO COMPLETE EVALUATION: Min-Moderate modification of tasks or assist with assess necessary to complete an evaluation.  OT OCCUPATIONAL PROFILE AND HISTORY: Problem focused assessment: Including review of records relating to presenting problem.  CLINICAL DECISION MAKING: LOW - limited treatment options, no task modification necessary  REHAB POTENTIAL: Good for goals stated  EVALUATION COMPLEXITY: Low    PLAN:  OT FREQUENCY: 2x/week  OT DURATION: 6 weeks  PLANNED INTERVENTIONS: self care/ADL training, therapeutic exercise, therapeutic activity, manual therapy, passive range of motion, functional mobility training, ultrasound, moist heat, patient/family education, cognitive remediation/compensation, coping strategies training, DME and/or AE instructions, and Re-evaluation  RECOMMENDED OTHER SERVICES: ST and PT referrals to establish multi-disciplinary care  CONSULTED AND AGREED WITH PLAN OF CARE: Patient and family member/caregiver  PLAN FOR NEXT SESSION: Set outcome measure based LTG #2; basic ROM HEP with focus on L shoulder pain (biceps tendon/rotator cuff?); Further testing of visual acuities   Delana Meyer, OT 08/03/2022, 3:55 PM  Physician Documentation Your signature is required to indicate approval of the treatment plan as stated above. By signing this report, you are approving the plan of care. Please sign and either send electronically or print and fax the signed copy to the number below. If you approve with modifications, please indicate those in the space provided._______________________________________________________________ Physician Signature:  _______________________________ Date:_______ Time:_____   [facility] [address] [place], [place], [address] Phone: [phone]   Fax:  [phone]  Name: Marvin Spencer MRN: [id number] Date of Birth: May 01, 1965

## 2022-08-07 ENCOUNTER — Ambulatory Visit: Admitting: Occupational Therapy

## 2022-08-07 ENCOUNTER — Encounter: Payer: Self-pay | Admitting: Occupational Therapy

## 2022-08-07 DIAGNOSIS — R41840 Attention and concentration deficit: Secondary | ICD-10-CM

## 2022-08-07 DIAGNOSIS — R29818 Other symptoms and signs involving the nervous system: Secondary | ICD-10-CM | POA: Diagnosis not present

## 2022-08-07 DIAGNOSIS — R278 Other lack of coordination: Secondary | ICD-10-CM

## 2022-08-07 DIAGNOSIS — R41842 Visuospatial deficit: Secondary | ICD-10-CM

## 2022-08-07 DIAGNOSIS — M6281 Muscle weakness (generalized): Secondary | ICD-10-CM

## 2022-08-07 NOTE — Patient Instructions (Signed)
  SUPINE: Shoulder Flexion Bilateral (Cane)    Lie on back with knees bent. Hold cane with both hands. Raise both arms overhead, keep elbows straight. _10__ reps per set, __2_ sets per day    ROM: External Rotation - Wand (Supine)    Lie on back holding wand with elbows bent to 90. Rotate forearms over head as far as possible.  Repeat __10__ times per set. Do __2__ sessions per day.    SELF ASSISTED WITH OBJECT: Shoulder Flexion - Sitting    Hold cane with both hands. Raise arms up. Keep elbows straight _10__ reps per set, __2_ sets per day   ROM: Abduction - Wand    Holding wand with left hand palm up, push wand directly out to Lt side, leading with other hand palm down, until stretch is felt.  Repeat _10___ times per set. Then repeat to Rt side with Rt palm up, other hand palm down x 10 reps. Do __2__ sessions per day.           Memory Compensation Strategies  Use "WARM" strategy.  W= write it down  A= associate it  R= repeat it  M= make a mental note  2.   You can keep a Glass blower/designer.  Use a 3-ring notebook with sections for the following: calendar, important names and phone numbers,  medications, doctors' names/phone numbers, lists/reminders, and a section to journal what you did each day.   3.    Use a calendar to write appointments down.  4.    Write yourself a schedule for the day.  This can be placed on the calendar or in a separate section of the Memory Notebook.  Keeping a regular schedule can help memory.  5.    Use medication organizer with sections for each day or morning/evening pills.  You may need help loading it  6.    Keep a basket, or pegboard by the door.  Place items that you need to take out with you in the basket or on the pegboard.  You may also want to  include a message board for reminders.  7.    Use sticky notes.  Place sticky notes with reminders in a place where the task is performed.  For example: " turn off the stove"  placed by the stove, "lock the door" placed on the door at eye level, " take your medications" on  the bathroom mirror or by the place where you normally take your medications.  8.    Use alarms/timers.  Use while cooking to remind yourself to check on food or as a reminder to take your medicine, or as a reminder to make a call, or as a reminder to perform another task, etc.

## 2022-08-07 NOTE — Therapy (Addendum)
OUTPATIENT OCCUPATIONAL THERAPY NEURO TREATMENT  Patient Name: Marvin Spencer MRN: [id number] DOB:Feb 05, 1966, 57 y.o., male Today's Date: 08/07/2022  PCP: Donita Brooks, MD  REFERRING PROVIDER: Arman Bogus, Eber Jones, MD  END OF SESSION:  OT End of Session - 08/07/22 0845     Visit Number 2    Number of Visits 13    Date for OT Re-Evaluation 09/14/22    Authorization Type [employer]    Authorization - Visit Number 15    OT Start Time 0844    OT Stop Time 0930    OT Time Calculation (min) 46 min    Activity Tolerance Patient tolerated treatment well    Behavior During Therapy WFL for tasks assessed/performed             Past Medical History:  Diagnosis Date   Diabetes mellitus without complication (HCC)    Hyperlipidemia    Hypertriglyceridemia    Peyronie disease    Past Surgical History:  Procedure Laterality Date   CIRCUMCISION     age 21   COLONOSCOPY     POLYPECTOMY     Patient Active Problem List   Diagnosis Date Noted   Vitamin D deficiency 03/20/2021   Type 2 diabetes mellitus with hyperglycemia, with long-term current use of insulin (HCC) 06/27/2019   Hyperlipidemia associated with type 2 diabetes mellitus (HCC) 06/27/2019   Peyronie disease    Hypertriglyceridemia    Diabetes mellitus without complication (HCC)     ONSET DATE: Date of referral - 06/07/2022  REFERRING DIAG: G30.8 (ICD-10-CM) - Other Alzheimer's disease  THERAPY DIAG:  Other symptoms and signs involving the nervous system  Other lack of coordination  Muscle weakness (generalized)  Attention and concentration deficit  Visuospatial deficit  Rationale for Evaluation and Treatment: Rehabilitation  SUBJECTIVE:   SUBJECTIVE STATEMENT: No pain except bilateral shoulder high level flexion and tightness with ER. Denies falls  Pt accompanied by: [name]  PERTINENT HISTORY: Pt referred to OP OT secondary to complications from Alzheimer's from [place].    PCP note states,"This 57 year old MALE patient is here for a followup coordination seems off with hand eye like picking up fork. Short term memory worsening. Concerns depth perception. Increased anxiety worsening depression. Making finacial mistake hasn't paid mortgage 3 months was paying tution instead. Working daily daughter driving. 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 He has seen neurology here at the [facility] and is willing to see neuropsychology. He has been diagnosed with dementia   no medications have been started. He declined lumbar puncture due to concerns of being paralyzed after the lumbar puncture  Issues with vision started about 1 year ago follow by short term memory loss.  States he no longer uses stove or oven and has confusion on using microwave.  His mood has declined due to his awareness of his progressive vision and memory  issues. "  PRECAUTIONS: Fall  WEIGHT BEARING RESTRICTIONS: No  PAIN:  Are you having pain? No; some B knee pain  FALLS: Has patient fallen in last 6 months? 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   OBJECTIVE:   HAND DOMINANCE: Right  ADLs: Overall ADLs: distance supervision  UB Dressing: min A to get L arm through; difficulty with sports coat for church   IADLs:  Light housekeeping: Trash, cleaning, change batteries in doors for alarm, push mow yard Meal Prep: has a culinary degree, make key lime cake, ground [place] burgers Community mobility: dependently Medication  management: supervision to min A Handwriting: Increased time and unable to write on the line for a signature  MOBILITY STATUS: Independent and lacks confidence with stairs; overall wife reports he  walks "gingerly"  ACTIVITY TOLERANCE: Activity tolerance: good  FUNCTIONAL OUTCOME MEASURES: To be assessed during upcoming visit  UPPER EXTREMITY ROM:     BUE - WNL L shoulder flexion to 90* then has pain for remaining range  UPPER EXTREMITY MMT:     BUE - WNL  HAND FUNCTION: Grip strength: Right: 63.2 lbs; Left: 76.4 lbs  COORDINATION: 9 Hole Peg test: Right: 45 sec; Left: 42 sec - required cues for sequencing  SENSATION: WFL  EDEMA: none reported or observed  MUSCLE TONE: WNL  COGNITION: Overall cognitive status: Impaired  VISION: Subjective report: Has difficulty processing what he is seeing Baseline vision: Wears glasses for reading only   VISION ASSESSMENT: Impaired Unable to read text on badge buddy; unable to read clock on wall, but able to read titles of posters on the wall.   PERCEPTION: Impaired: Spatial orientation: better with demonstration and repetition  PRAXIS: Impaired: Initiation and Motor planning  OBSERVATIONS: Pt appears well-kept. No AD, no LOB with ambulation.   TODAY'S TREATMENT:                                                                                                                               Issued and discussed memory compensatory strategies with pt/wife - see pt instructions Issued cane HEP and pt demo each x 10 reps with mod to max cueing - see pt instructions  PATIENT EDUCATION: Education details: memory strategies, cane HEP  Person educated: Patient and Spouse Education method: Explanation, Demonstration, Verbal cues, and Handouts Education comprehension: verbalized understanding, returned demonstration, verbal cues required, and needs further education  HOME EXERCISE PROGRAM: 08/07/22: memory strategies, cane  HEP  GOALS:  SHORT TERM GOALS: Target date: 08/31/2022    Patient will demonstrate simple UE ROM HEP with 25% verbal cues or less for proper execution. Baseline: Goal status: IN PROGRESS  2.  Patient's caregiver will independently recall at least 2 compensatory strategies for visual impairment without cueing.  Baseline:  Goal status: INITIAL  3.  Patient's caregiver will verbalize understanding of activities to promote visual scanning.  Baseline:  Goal status: INITIAL   LONG TERM GOALS: Target date: 09/14/2022    Patient will demonstrate at least 68.2 lbs R grip strength as needed to open jars and other  containers. Baseline: 63.2 lbs Goal status: INITIAL  2.  Will set appropriate outcome measure based goal. Baseline:  Goal status: INITIAL  3.  Patient will complete nine-hole peg with use of each hand in 35 seconds or less. Baseline: Right: 45 sec; Left: 42 sec  Goal status: INITIAL  4.  Pt will complete UB dressing mod I with use of adaptive strategies such as sequencing chart as needed.  Baseline: min A to thread LUE Goal status: INITIAL   ASSESSMENT:  CLINICAL IMPRESSION: Patient requires multiple cues for HEP d/t memory deficits and poor awareness of proper positioning. Will benefit from review  PERFORMANCE DEFICITS: in functional skills including ADLs, IADLs, coordination, ROM, strength, pain, Fine motor control, Gross motor control, mobility, vision, and UE functional use, cognitive skills including attention, energy/drive, memory, orientation, perception, problem solving, sequencing, and understand.  IMPAIRMENTS: are limiting patient from ADLs, IADLs, rest and sleep, work, leisure, and social participation.   CO-MORBIDITIES: may have co-morbidities  that affects occupational performance. Patient will benefit from skilled OT to address above impairments and improve overall function.  MODIFICATION OR ASSISTANCE TO COMPLETE EVALUATION: Min-Moderate modification of  tasks or assist with assess necessary to complete an evaluation.  OT OCCUPATIONAL PROFILE AND HISTORY: Problem focused assessment: Including review of records relating to presenting problem.  CLINICAL DECISION MAKING: LOW - limited treatment options, no task modification necessary  REHAB POTENTIAL: Good for goals stated  EVALUATION COMPLEXITY: Low    PLAN:  OT FREQUENCY: 2x/week  OT DURATION: 6 weeks  PLANNED INTERVENTIONS: self care/ADL training, therapeutic exercise, therapeutic activity, manual therapy, passive range of motion, functional mobility training, ultrasound, moist heat, patient/family education, cognitive remediation/compensation, coping strategies training, DME and/or AE instructions, and Re-evaluation  RECOMMENDED OTHER SERVICES: ST and PT referrals to establish multi-disciplinary care  CONSULTED AND AGREED WITH PLAN OF CARE: Patient and family member/caregiver  PLAN FOR NEXT SESSION: Set outcome measure based LTG #2;  Further testing of visual acuities, review cane HEP    Sheran Lawless, OT 08/07/2022, 8:47 AM

## 2022-08-08 ENCOUNTER — Ambulatory Visit: Admitting: Occupational Therapy

## 2022-08-14 ENCOUNTER — Ambulatory Visit: Admitting: Occupational Therapy

## 2022-08-16 ENCOUNTER — Ambulatory Visit: Admitting: Occupational Therapy

## 2022-08-20 ENCOUNTER — Ambulatory Visit: Attending: Internal Medicine | Admitting: Occupational Therapy

## 2022-08-20 ENCOUNTER — Encounter: Payer: Self-pay | Admitting: Occupational Therapy

## 2022-08-20 DIAGNOSIS — R41840 Attention and concentration deficit: Secondary | ICD-10-CM | POA: Insufficient documentation

## 2022-08-20 DIAGNOSIS — R278 Other lack of coordination: Secondary | ICD-10-CM | POA: Diagnosis present

## 2022-08-20 DIAGNOSIS — M6281 Muscle weakness (generalized): Secondary | ICD-10-CM | POA: Insufficient documentation

## 2022-08-20 DIAGNOSIS — R29818 Other symptoms and signs involving the nervous system: Secondary | ICD-10-CM | POA: Diagnosis present

## 2022-08-20 DIAGNOSIS — R41842 Visuospatial deficit: Secondary | ICD-10-CM | POA: Diagnosis present

## 2022-08-20 NOTE — Therapy (Signed)
OUTPATIENT OCCUPATIONAL THERAPY NEURO TREATMENT  Patient Name: Marvin Spencer MRN: [id number] DOB:1965-08-19, 57 y.o., male Today's Date: 08/20/2022  PCP: Donita Brooks, MD  REFERRING PROVIDER: Arman Bogus, Eber Jones, MD  END OF SESSION:  OT End of Session - 08/20/22 1230     Visit Number 3    Number of Visits 13    Date for OT Re-Evaluation 09/14/22    Authorization Type [employer]    Authorization - Visit Number 15    OT Start Time 1230    OT Stop Time 1315    OT Time Calculation (min) 45 min    Activity Tolerance Patient tolerated treatment well    Behavior During Therapy WFL for tasks assessed/performed             Past Medical History:  Diagnosis Date   Diabetes mellitus without complication (HCC)    Hyperlipidemia    Hypertriglyceridemia    Peyronie disease    Past Surgical History:  Procedure Laterality Date   CIRCUMCISION     age 67   COLONOSCOPY     POLYPECTOMY     Patient Active Problem List   Diagnosis Date Noted   Vitamin D deficiency 03/20/2021   Type 2 diabetes mellitus with hyperglycemia, with long-term current use of insulin (HCC) 06/27/2019   Hyperlipidemia associated with type 2 diabetes mellitus (HCC) 06/27/2019   Peyronie disease    Hypertriglyceridemia    Diabetes mellitus without complication (HCC)     ONSET DATE: Date of referral - 06/07/2022  REFERRING DIAG: G30.8 (ICD-10-CM) - Other Alzheimer's disease  THERAPY DIAG:  Other symptoms and signs involving the nervous system  Other lack of coordination  Attention and concentration deficit  Visuospatial deficit  Muscle weakness (generalized)  Rationale for Evaluation and Treatment: Rehabilitation  SUBJECTIVE:   SUBJECTIVE STATEMENT: No pain except bilateral shoulder high level flexion and tightness with ER. Denies falls  Pt accompanied by: [name]  PERTINENT HISTORY: Pt referred to OP OT secondary to complications from Alzheimer's from [place].    PCP note states,"This 57 year old MALE patient is here for a followup coordination seems off with hand eye like picking up fork. Short term memory worsening. Concerns depth perception. Increased anxiety worsening depression. Making finacial mistake hasn't paid mortgage 3 months was paying tution instead. Working daily daughter driving. 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 He has seen neurology here at the [facility] and is willing to see neuropsychology. He has been diagnosed with dementia   no medications have been started. He declined lumbar puncture due to concerns of being paralyzed after the lumbar puncture  Issues with vision started about 1 year ago follow by short term memory loss.  States he no longer uses stove or oven and has confusion on using microwave.  His mood has declined due to his awareness of his progressive vision and memory  issues. "  PRECAUTIONS: Fall  WEIGHT BEARING RESTRICTIONS: No  PAIN:  Are you having pain? No; some B knee pain  FALLS: Has patient fallen in last 6 months? 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   PATIENT GOALS: To be as independent as possible.   OBJECTIVE:   HAND DOMINANCE: Right  ADLs: Overall ADLs: distance supervision  UB Dressing: min A to get L arm through; difficulty with sports coat for church   IADLs:  Light housekeeping: Trash, cleaning, change batteries in doors for alarm, push mow yard Meal Prep: has a culinary degree, make key lime cake, ground  [place] burgers Community mobility: dependently Medication management: supervision to min A Handwriting: Increased time and unable to write on the line for a signature  MOBILITY STATUS: Independent and lacks confidence with stairs; overall wife reports he  walks "gingerly"  ACTIVITY TOLERANCE: Activity tolerance: good  FUNCTIONAL OUTCOME MEASURES: 08/20/22: PSFS = 5.3/10  UPPER EXTREMITY ROM:     BUE - WNL L shoulder flexion to 90* then has pain for remaining range  UPPER EXTREMITY MMT:     BUE - WNL  HAND FUNCTION: Grip strength: Right: 63.2 lbs; Left: 76.4 lbs  COORDINATION: 9 Hole Peg test: Right: 45 sec; Left: 42 sec - required cues for sequencing  SENSATION: WFL  EDEMA: none reported or observed  MUSCLE TONE: WNL  COGNITION: Overall cognitive status: Impaired  VISION: Subjective report: Has difficulty processing what he is seeing Baseline vision: Wears glasses for reading only   VISION ASSESSMENT: Impaired Unable to read text on badge buddy; unable to read clock on wall, but able to read titles of posters on the wall.   PERCEPTION: Impaired: Spatial orientation: better with demonstration and repetition  PRAXIS: Impaired: Initiation and Motor planning  OBSERVATIONS: Pt appears well-kept. No AD, no LOB with ambulation.   TODAY'S TREATMENT:                                                                                                                               Pt id 3 activities/tasks that give him trouble for PSFS including:  1) Putting a shirt overhead (getting backwards at times d/t visual/perceptual deficits)  2) Remembering where he put something  3) Remembering and paying bills  Baseline: 5.3/10 total score   Reviewed and expanded on memory strategies including: specific location for items, routine/habits, memory notebook, etc  Reviewed cane HEP - pt still required mutliple cues.   Wife waited in lobby however therapist did come up and  discuss some strategies and concerns with her at end of session  PATIENT EDUCATION: Education details: memory strategies, cane HEP  Person educated: Patient and Spouse Education method: Explanation, Demonstration, Verbal cues, and Handouts Education comprehension: verbalized understanding, returned demonstration, verbal cues required, and needs further education  HOME EXERCISE PROGRAM: 08/07/22: memory strategies, cane HEP  GOALS:  SHORT TERM GOALS: Target date: 08/31/2022    Patient will demonstrate simple UE ROM HEP with 25% verbal cues or less for proper execution. Baseline: Goal status: IN PROGRESS  2.  Patient's caregiver will independently recall at least 2 compensatory strategies for visual impairment without cueing.  Baseline:  Goal status: INITIAL  3.  Patient's caregiver will verbalize understanding of activities to promote visual scanning.  Baseline:  Goal status: INITIAL   LONG TERM GOALS: Target date: 09/14/2022    Patient will demonstrate at least 68.2 lbs R grip strength as needed to open jars and other containers. Baseline: 63.2 lbs Goal status: INITIAL  2.  Pt will score 7/10 on PSFS for same activities (assessed on 08/20/22)  Baseline: 5.3/10 Goal status: INITIAL  3.  Patient will complete nine-hole peg with use of each hand in 35 seconds or less. Baseline: Right: 45 sec; Left: 42 sec  Goal status: INITIAL  4.  Pt will complete UB dressing mod I with use of adaptive strategies such as sequencing chart as needed.  Baseline: min A to thread LUE Goal status: INITIAL   ASSESSMENT:  CLINICAL IMPRESSION: Patient requires multiple cues for HEP d/t memory deficits and poor awareness of proper positioning. Will benefit from review.   PERFORMANCE DEFICITS: in functional skills including ADLs, IADLs, coordination, ROM, strength, pain, Fine motor control, Gross motor control, mobility, vision, and UE functional use, cognitive skills including attention,  energy/drive, memory, orientation, perception, problem solving, sequencing, and understand.  IMPAIRMENTS: are limiting patient from ADLs, IADLs, rest and sleep, work, leisure, and social participation.   CO-MORBIDITIES: may have co-morbidities  that affects occupational performance. Patient will benefit from skilled OT to address above impairments and improve overall function.  MODIFICATION OR ASSISTANCE TO COMPLETE EVALUATION: Min-Moderate modification of tasks or assist with assess necessary to complete an evaluation.  OT OCCUPATIONAL PROFILE AND HISTORY: Problem focused assessment: Including review of records relating to presenting problem.  CLINICAL DECISION MAKING: LOW - limited treatment options, no task modification necessary  REHAB POTENTIAL: Good for goals stated  EVALUATION COMPLEXITY: Low    PLAN:  OT FREQUENCY: 2x/week  OT DURATION: 6 weeks  PLANNED INTERVENTIONS: self care/ADL training, therapeutic exercise, therapeutic activity, manual therapy, passive range of motion, functional mobility training, ultrasound, moist heat, patient/family education, cognitive remediation/compensation, coping strategies training, DME and/or AE instructions, and Re-evaluation  RECOMMENDED OTHER SERVICES: ST and PT referrals to establish multi-disciplinary care  CONSULTED AND AGREED WITH PLAN OF CARE: Patient and family member/caregiver  PLAN FOR NEXT SESSION: visual/perceptual activities, visual scanning   Sheran Lawless, OT 08/20/2022, 12:31 PM

## 2022-08-22 ENCOUNTER — Ambulatory Visit: Admitting: Occupational Therapy

## 2022-08-27 ENCOUNTER — Encounter: Payer: Self-pay | Admitting: Occupational Therapy

## 2022-08-27 ENCOUNTER — Ambulatory Visit: Admitting: Occupational Therapy

## 2022-08-27 DIAGNOSIS — R41842 Visuospatial deficit: Secondary | ICD-10-CM

## 2022-08-27 DIAGNOSIS — R29818 Other symptoms and signs involving the nervous system: Secondary | ICD-10-CM

## 2022-08-27 DIAGNOSIS — R278 Other lack of coordination: Secondary | ICD-10-CM

## 2022-08-27 DIAGNOSIS — R41840 Attention and concentration deficit: Secondary | ICD-10-CM

## 2022-08-27 NOTE — Therapy (Signed)
OUTPATIENT OCCUPATIONAL THERAPY NEURO TREATMENT  Patient Name: Marvin Spencer MRN: [id number] DOB:14-Sep-1965, 57 y.o., male Today's Date: 08/27/2022  PCP: Donita Brooks, MD  REFERRING PROVIDER: Arman Bogus, Eber Jones, MD  END OF SESSION:  OT End of Session - 08/27/22 0935     Visit Number 4    Number of Visits 13    Date for OT Re-Evaluation 09/14/22    Authorization Type [employer]    Authorization - Visit Number 15    OT Start Time 0935    OT Stop Time 1015    OT Time Calculation (min) 40 min    Activity Tolerance Patient tolerated treatment well    Behavior During Therapy WFL for tasks assessed/performed             Past Medical History:  Diagnosis Date   Diabetes mellitus without complication (HCC)    Hyperlipidemia    Hypertriglyceridemia    Peyronie disease    Past Surgical History:  Procedure Laterality Date   CIRCUMCISION     age 60   COLONOSCOPY     POLYPECTOMY     Patient Active Problem List   Diagnosis Date Noted   Vitamin D deficiency 03/20/2021   Type 2 diabetes mellitus with hyperglycemia, with long-term current use of insulin (HCC) 06/27/2019   Hyperlipidemia associated with type 2 diabetes mellitus (HCC) 06/27/2019   Peyronie disease    Hypertriglyceridemia    Diabetes mellitus without complication (HCC)     ONSET DATE: Date of referral - 06/07/2022  REFERRING DIAG: G30.8 (ICD-10-CM) - Other Alzheimer's disease  THERAPY DIAG:  Other symptoms and signs involving the nervous system  Other lack of coordination  Attention and concentration deficit  Visuospatial deficit  Rationale for Evaluation and Treatment: Rehabilitation  SUBJECTIVE:   SUBJECTIVE STATEMENT: No pain except bilateral shoulder high level flexion and tightness with ER. Denies falls  Pt accompanied by: [name]  PERTINENT HISTORY: Pt referred to OP OT secondary to complications from Alzheimer's from [place].   PCP note states,"This 57 year old  MALE patient is here for a followup coordination seems off with hand eye like picking up fork. Short term memory worsening. Concerns depth perception. Increased anxiety worsening depression. Making finacial mistake hasn't paid mortgage 3 months was paying tution instead. Working daily daughter driving. 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 He has seen neurology here at the [facility] and is willing to see neuropsychology. He has been diagnosed with dementia   no medications have been started. He declined lumbar puncture due to concerns of being paralyzed after the lumbar puncture  Issues with vision started about 1 year ago follow by short term memory loss.  States he no longer uses stove or oven and has confusion on using microwave.  His mood has declined due to his awareness of his progressive vision and memory  issues. "  PRECAUTIONS: Fall  WEIGHT BEARING RESTRICTIONS: No  PAIN:  Are you having pain? No; some B knee pain  FALLS: Has patient fallen in last 6 months? 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   PATIENT GOALS: To be as independent as possible.   OBJECTIVE:   HAND DOMINANCE: Right  ADLs: Overall ADLs: distance supervision  UB Dressing: min A to get L arm through; difficulty with sports coat for church   IADLs:  Light housekeeping: Trash, cleaning, change batteries in doors for alarm, push mow yard Meal Prep: has a culinary degree, make key lime cake, ground [place] burgers Community mobility:  dependently Medication management: supervision to min A Handwriting: Increased time and unable to write on the line for a signature  MOBILITY STATUS: Independent and lacks confidence with stairs; overall wife reports he  walks "gingerly"  ACTIVITY TOLERANCE: Activity tolerance: good  FUNCTIONAL OUTCOME MEASURES: 08/20/22: PSFS = 5.3/10  UPPER EXTREMITY ROM:     BUE - WNL L shoulder flexion to 90* then has pain for remaining range  UPPER EXTREMITY MMT:     BUE - WNL  HAND FUNCTION: Grip strength: Right: 63.2 lbs; Left: 76.4 lbs  COORDINATION: 9 Hole Peg test: Right: 45 sec; Left: 42 sec - required cues for sequencing  SENSATION: WFL  EDEMA: none reported or observed  MUSCLE TONE: WNL  COGNITION: Overall cognitive status: Impaired  VISION: Subjective report: Has difficulty processing what he is seeing Baseline vision: Wears glasses for reading only   VISION ASSESSMENT: Impaired Unable to read text on badge buddy; unable to read clock on wall, but able to read titles of posters on the wall.   PERCEPTION: Impaired: Spatial orientation: better with demonstration and repetition  PRAXIS: Impaired: Initiation and Motor planning  OBSERVATIONS: Pt appears well-kept. No AD, no LOB with ambulation.   TODAY'S TREATMENT:                                                                                                                               Pt crossing out #88 (13M size) with glasses for visual scanning: cues required for organized scanning pattern (Lt to Rt), cues to slow down and use finger as line guide, pt also repeating lines, with only approx. 40% accuracy (finding 7, missing 9) - missing more in middle and latter part of lines than beginning of line.  Letter cancellation (74m size), lower case with use of blank paper as line guide and cues to go slow from Lt to Rt - pt still doing line above at times and occasionally highlighting incorrect letter. Needs assist/cues to  go to next line correctly for each and every line. Pt did better with line guide but still required assist to keep line guide on correct line/ not skip a line. Pt approx 50% accuracy  Pt with extra time required and difficulty with above tasks  Attempted simple PVC design but pt required max assist (losing place, unable to recognize correct pieces, etc)   PATIENT EDUCATION: Education details: memory strategies, cane HEP  Person educated: Patient and Spouse  Education method: Explanation, Demonstration, Verbal cues, and Handouts Education comprehension: verbalized understanding, returned demonstration, verbal cues required, and needs further education  HOME EXERCISE PROGRAM: 08/07/22: memory strategies, cane HEP  GOALS:  SHORT TERM GOALS: Target date: 08/31/2022    Patient will demonstrate simple UE ROM HEP with 25% verbal cues or less for proper execution. Baseline: Goal status: IN PROGRESS  2.  Patient's caregiver will independently recall at least 2 compensatory strategies for visual impairment without cueing.  Baseline:  Goal status: INITIAL  3.  Patient's caregiver will verbalize understanding of activities to promote visual scanning.  Baseline:  Goal status: INITIAL   LONG TERM GOALS: Target date: 09/14/2022    Patient will demonstrate at least 68.2 lbs R grip strength as needed to open jars and other containers. Baseline: 63.2 lbs Goal status: INITIAL  2.  Pt will score 7/10 on PSFS for same activities (assessed on 08/20/22)  Baseline: 5.3/10 Goal status: INITIAL  3.  Patient will complete nine-hole peg with use of each hand in 35 seconds or less. Baseline: Right: 45 sec; Left: 42 sec  Goal status: INITIAL  4.  Pt will complete UB dressing mod I with use of adaptive strategies such as sequencing chart as needed.  Baseline: min A to thread LUE Goal status: INITIAL   ASSESSMENT:  CLINICAL IMPRESSION: Patient requires multiple cues for visual scanning activities  d/t cognitive deficits.  PERFORMANCE DEFICITS: in functional skills including ADLs, IADLs, coordination, ROM, strength, pain, Fine motor control, Gross motor control, mobility, vision, and UE functional use, cognitive skills including attention, energy/drive, memory, orientation, perception, problem solving, sequencing, and understand.  IMPAIRMENTS: are limiting patient from ADLs, IADLs, rest and sleep, work, leisure, and social participation.   CO-MORBIDITIES: may have co-morbidities  that affects occupational performance. Patient will benefit from skilled OT to address above impairments and improve overall function.  MODIFICATION OR ASSISTANCE TO COMPLETE EVALUATION: Min-Moderate modification of tasks or assist with assess necessary to complete an evaluation.  OT OCCUPATIONAL PROFILE AND HISTORY: Problem focused assessment: Including review of records relating to presenting problem.  CLINICAL DECISION MAKING: LOW - limited treatment options, no task modification necessary  REHAB POTENTIAL: Good for goals stated  EVALUATION COMPLEXITY: Low    PLAN:  OT FREQUENCY: 2x/week  OT DURATION: 6 weeks  PLANNED INTERVENTIONS: self care/ADL training, therapeutic exercise, therapeutic activity, manual therapy, passive range of motion, functional mobility training, ultrasound, moist heat, patient/family education, cognitive remediation/compensation, coping strategies training, DME and/or AE instructions, and Re-evaluation  RECOMMENDED OTHER SERVICES: ST and PT referrals to establish multi-disciplinary care  CONSULTED AND AGREED WITH PLAN OF CARE: Patient and family member/caregiver  PLAN FOR NEXT SESSION: continue simple visual scanning, memory strategies, ADLS   Sheran Lawless, OT 02/24/1094, 9:35 AM

## 2022-08-29 ENCOUNTER — Ambulatory Visit: Admitting: Occupational Therapy

## 2022-08-29 DIAGNOSIS — M6281 Muscle weakness (generalized): Secondary | ICD-10-CM

## 2022-08-29 DIAGNOSIS — R278 Other lack of coordination: Secondary | ICD-10-CM

## 2022-08-29 DIAGNOSIS — R29818 Other symptoms and signs involving the nervous system: Secondary | ICD-10-CM

## 2022-08-29 DIAGNOSIS — R41840 Attention and concentration deficit: Secondary | ICD-10-CM

## 2022-08-29 DIAGNOSIS — R41842 Visuospatial deficit: Secondary | ICD-10-CM

## 2022-08-29 NOTE — Patient Instructions (Signed)
Vision Strategies  1. Look for the edge of objects (to the left and/or right) so that you make sure you are seeing all of an object  2. Turn your head when walking, scan from side to side, particularly in busy environments  3. Use an organized scanning pattern. It's usually easier to scan from top to bottom, and left to right (like you are reading)  4. Double check yourself  5. Use a line guide (like a blank piece of paper) or your finger when reading  6. If necessary, place brightly colored tape at end of table or work area as a reminder to always look until you see the tape.    Activities to try at home to encourage visual scanning:   1. Word searches 2. Mazes 3. Puzzles 4. Card games 5. Computer games and/or searches 6. Connect-the-dots  Activities for environmental (larger) scanning:   1. With supervision, scan for items in grocery store or drugstore.  Begin with a familiar store, then progress to a new store you've never been in before. Make sure you have supervision with this.   2. Complete I spy outside to identify objects of a certain color or that starts with a certain letter.

## 2022-08-29 NOTE — Therapy (Signed)
OUTPATIENT OCCUPATIONAL THERAPY NEURO TREATMENT  Patient Name: Marvin Spencer MRN: [id number] DOB:1965/12/14, 57 y.o., male Today's Date: 08/29/2022  PCP: Donita Brooks, MD  REFERRING PROVIDER: Arman Bogus, Eber Jones, MD  END OF SESSION:  OT End of Session - 08/29/22 1152     Visit Number 5    Number of Visits 13    Date for OT Re-Evaluation 09/14/22    Authorization Type [employer]    OT Start Time 1152    OT Stop Time 1231    OT Time Calculation (min) 39 min    Activity Tolerance Patient tolerated treatment well    Behavior During Therapy WFL for tasks assessed/performed             Past Medical History:  Diagnosis Date   Diabetes mellitus without complication (HCC)    Hyperlipidemia    Hypertriglyceridemia    Peyronie disease    Past Surgical History:  Procedure Laterality Date   CIRCUMCISION     age 16   COLONOSCOPY     POLYPECTOMY     Patient Active Problem List   Diagnosis Date Noted   Vitamin D deficiency 03/20/2021   Type 2 diabetes mellitus with hyperglycemia, with long-term current use of insulin (HCC) 06/27/2019   Hyperlipidemia associated with type 2 diabetes mellitus (HCC) 06/27/2019   Peyronie disease    Hypertriglyceridemia    Diabetes mellitus without complication (HCC)     ONSET DATE: Date of referral - 06/07/2022  REFERRING DIAG: G30.8 (ICD-10-CM) - Other Alzheimer's disease  THERAPY DIAG:  Other symptoms and signs involving the nervous system  Other lack of coordination  Attention and concentration deficit  Visuospatial deficit  Muscle weakness (generalized)  Rationale for Evaluation and Treatment: Rehabilitation  SUBJECTIVE:   SUBJECTIVE STATEMENT: He tried to pump gas this morning but was unable to look at the correct place on the pump to do so. His wife told him to get back in the car and he became frustrated.   Pt accompanied by: [name]  PERTINENT HISTORY: Pt referred to OP OT secondary to  complications from Alzheimer's from [place].   PCP note states,"This 57 year old MALE patient is here for a followup coordination seems off with hand eye like picking up fork. Short term memory worsening. Concerns depth perception. Increased anxiety worsening depression. Making finacial mistake hasn't paid mortgage 3 months was paying tution instead. Working daily daughter driving. 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 He has seen neurology here at the [facility] and is willing to see neuropsychology. He has been diagnosed with dementia   no medications have been started. He declined lumbar puncture due to concerns of being paralyzed after the lumbar puncture  Issues with vision started about 1 year ago follow by short term memory loss.  States he no longer uses stove or oven and has confusion on using microwave.  His mood has declined due to his awareness of his progressive vision and memory  issues. "  PRECAUTIONS: Fall  WEIGHT BEARING RESTRICTIONS: No  PAIN:  Are you having pain? No; some B knee pain  FALLS: Has patient fallen in last 6 months? 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   PATIENT GOALS: To be as independent as possible.   OBJECTIVE:   HAND DOMINANCE: Right  ADLs: Overall ADLs: distance supervision  UB Dressing: min A to get L arm through; difficulty with sports coat for church  IADLs:  Light housekeeping: Trash, cleaning, change batteries in doors for alarm, push mow yard Meal Prep: has a culinary  degree, make key lime cake, ground [place] burgers Community mobility: dependently Medication management: supervision to min A Handwriting: Increased time and unable to write on the line for a signature  MOBILITY STATUS: Independent and lacks confidence with stairs; overall wife reports he  walks "gingerly"  ACTIVITY TOLERANCE: Activity tolerance: good  FUNCTIONAL OUTCOME MEASURES: 08/20/22: PSFS = 5.3/10  UPPER EXTREMITY ROM:     BUE - WNL L shoulder flexion to 90* then has pain for remaining range  UPPER EXTREMITY MMT:     BUE - WNL  HAND FUNCTION: Grip strength: Right: 63.2 lbs; Left: 76.4 lbs  COORDINATION: 9 Hole Peg test: Right: 45 sec; Left: 42 sec - required cues for sequencing  SENSATION: WFL  EDEMA: none reported or observed  MUSCLE TONE: WNL  COGNITION: Overall cognitive status: Impaired  VISION: Subjective report: Has difficulty processing what he is seeing Baseline vision: Wears glasses for reading only   VISION ASSESSMENT: Impaired Unable to read text on badge buddy; unable to read clock on wall, but able to read titles of posters on the wall.   PERCEPTION: Impaired: Spatial orientation: better with demonstration and repetition  PRAXIS: Impaired: Initiation and Motor planning  OBSERVATIONS: Pt appears well-kept. No AD, no LOB with ambulation.   TODAY'S TREATMENT:                                                                                                                              OT educated pt and spouse on environmental scanning strategies and activities to promote environmental scanning as noted in pt instructions.   Pt attempted word search on iPad at level 10 as therapist was unable to adjust. Pt requiring multi-modal cueing for completion with increased time to promote attention and scanning.    PATIENT EDUCATION: Education details: environmental scanning Person educated: Patient and Spouse Education method: Explanation,  Facilities manager, Verbal cues, and Handouts Education comprehension: verbalized understanding, returned demonstration, verbal cues required, and needs further education  HOME EXERCISE PROGRAM: 08/07/22: memory strategies, cane HEP  GOALS:  SHORT TERM GOALS: Target date: 08/31/2022    Patient will demonstrate simple UE ROM HEP with 25% verbal cues or less for proper execution. Baseline: Goal status: IN PROGRESS  2.  Patient's caregiver will independently recall at least 2 compensatory strategies for visual impairment without cueing.  Baseline:  Goal status: INITIAL  3.  Patient's caregiver will verbalize understanding  of activities to promote visual scanning.  Baseline:  Goal status: INITIAL   LONG TERM GOALS: Target date: 09/14/2022    Patient will demonstrate at least 68.2 lbs R grip strength as needed to open jars and other containers. Baseline: 63.2 lbs Goal status: INITIAL  2.  Pt will score 7/10 on PSFS for same activities (assessed on 08/20/22)  Baseline: 5.3/10 Goal status: INITIAL  3.  Patient will complete nine-hole peg with use of each hand in 35 seconds or less. Baseline: Right: 45 sec; Left: 42 sec  Goal status: INITIAL  4.  Pt will complete UB dressing mod I with use of adaptive strategies such as sequencing chart as needed.  Baseline: min A to thread LUE Goal status: INITIAL   ASSESSMENT:  CLINICAL IMPRESSION: Patient requires multimodal cueing for visual scanning activities d/t cognitive deficits. Pt to benefit for extensive practice in efforts to improve functional performance.  PERFORMANCE DEFICITS: in functional skills including ADLs, IADLs, coordination, ROM, strength, pain, Fine motor control, Gross motor control, mobility, vision, and UE functional use, cognitive skills including attention, energy/drive, memory, orientation, perception, problem solving, sequencing, and understand.  IMPAIRMENTS: are limiting patient from ADLs, IADLs, rest and sleep,  work, leisure, and social participation.   CO-MORBIDITIES: may have co-morbidities  that affects occupational performance. Patient will benefit from skilled OT to address above impairments and improve overall function.  MODIFICATION OR ASSISTANCE TO COMPLETE EVALUATION: Min-Moderate modification of tasks or assist with assess necessary to complete an evaluation.  OT OCCUPATIONAL PROFILE AND HISTORY: Problem focused assessment: Including review of records relating to presenting problem.  CLINICAL DECISION MAKING: LOW - limited treatment options, no task modification necessary  REHAB POTENTIAL: Good for goals stated  PLAN:  OT FREQUENCY: 2x/week  OT DURATION: 6 weeks  PLANNED INTERVENTIONS: self care/ADL training, therapeutic exercise, therapeutic activity, manual therapy, passive range of motion, functional mobility training, ultrasound, moist heat, patient/family education, cognitive remediation/compensation, coping strategies training, DME and/or AE instructions, and Re-evaluation  RECOMMENDED OTHER SERVICES: ST and PT referrals to establish multi-disciplinary care  CONSULTED AND AGREED WITH PLAN OF CARE: Patient and family member/caregiver  PLAN FOR NEXT SESSION: review visual scanning, memory strategies, ADLS   Delana Meyer, OT 1/61/0960, 5:14 PM

## 2022-09-03 ENCOUNTER — Ambulatory Visit: Admitting: Occupational Therapy

## 2022-09-05 ENCOUNTER — Ambulatory Visit: Admitting: Occupational Therapy

## 2022-09-05 DIAGNOSIS — R41842 Visuospatial deficit: Secondary | ICD-10-CM

## 2022-09-05 DIAGNOSIS — R29818 Other symptoms and signs involving the nervous system: Secondary | ICD-10-CM

## 2022-09-05 DIAGNOSIS — R278 Other lack of coordination: Secondary | ICD-10-CM

## 2022-09-05 DIAGNOSIS — R41840 Attention and concentration deficit: Secondary | ICD-10-CM

## 2022-09-05 DIAGNOSIS — M6281 Muscle weakness (generalized): Secondary | ICD-10-CM

## 2022-09-05 NOTE — Therapy (Signed)
OUTPATIENT OCCUPATIONAL THERAPY NEURO TREATMENT  Patient Name: DANIELJOSEPH ULVEN MRN: [id number] DOB:[birth year], 57 y.o., male Today's Date: 09/05/2022  PCP: Donita Brooks, MD  REFERRING PROVIDER: Arman Bogus, Eber Jones, MD  END OF SESSION:  OT End of Session - 09/05/22 1152     Visit Number 6    Number of Visits 13    Date for OT Re-Evaluation 09/14/22    Authorization Type [employer]    OT Start Time 1152    OT Stop Time 1231    OT Time Calculation (min) 39 min    Activity Tolerance Patient tolerated treatment well    Behavior During Therapy WFL for tasks assessed/performed             Past Medical History:  Diagnosis Date   Diabetes mellitus without complication (HCC)    Hyperlipidemia    Hypertriglyceridemia    Peyronie disease    Past Surgical History:  Procedure Laterality Date   CIRCUMCISION     age 18   COLONOSCOPY     POLYPECTOMY     Patient Active Problem List   Diagnosis Date Noted   Vitamin D deficiency 03/20/2021   Type 2 diabetes mellitus with hyperglycemia, with long-term current use of insulin (HCC) 06/27/2019   Hyperlipidemia associated with type 2 diabetes mellitus (HCC) 06/27/2019   Peyronie disease    Hypertriglyceridemia    Diabetes mellitus without complication (HCC)     ONSET DATE: Date of referral - 06/07/2022  REFERRING DIAG: G30.8 (ICD-10-CM) - Other Alzheimer's disease  THERAPY DIAG:  Other lack of coordination  Other symptoms and signs involving the nervous system  Attention and concentration deficit  Visuospatial deficit  Muscle weakness (generalized)  Rationale for Evaluation and Treatment: Rehabilitation  SUBJECTIVE:   SUBJECTIVE STATEMENT: He often has difficulty finding items in home especially he feels because there is a lack of adequate lighting.   Pt accompanied by: [name]  PERTINENT HISTORY: Pt referred to OP OT secondary to complications from Alzheimer's from [place].   PCP note  states,"This 57 year old MALE patient is here for a followup coordination seems off with hand eye like picking up fork. Short term memory worsening. Concerns depth perception. Increased anxiety worsening depression. Making finacial mistake hasn't paid mortgage 3 months was paying tution instead. Working daily daughter driving. 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 He has seen neurology here at the [facility] and is willing to see neuropsychology. He has been diagnosed with dementia   no medications have been started. He declined lumbar puncture due to concerns of being paralyzed after the lumbar puncture  Issues with vision started about 1 year ago follow by short term memory loss.  States he no longer uses stove or oven and has confusion on using microwave.  His mood has declined due to his awareness of his progressive vision and memory  issues. "  PRECAUTIONS: Fall  WEIGHT BEARING RESTRICTIONS: No  PAIN:  Are you having pain? No; some B knee pain  FALLS: Has patient fallen in last 6 months? 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   PATIENT GOALS: To be as independent as possible.   OBJECTIVE:   HAND DOMINANCE: Right  ADLs: Overall ADLs: distance supervision  UB Dressing: min A to get L arm through; difficulty with sports coat for church  IADLs:  Light housekeeping: Trash, cleaning, change batteries in doors for alarm, push mow yard Meal Prep: has a culinary degree, make key lime cake, ground [place]  burgers Community mobility: dependently Medication management: supervision to min A Handwriting: Increased time and unable to write on the line for a signature  MOBILITY STATUS: Independent and lacks confidence with stairs; overall wife reports he  walks "gingerly"  ACTIVITY TOLERANCE: Activity tolerance: good  FUNCTIONAL OUTCOME MEASURES: 08/20/22: PSFS = 5.3/10  UPPER EXTREMITY ROM:     BUE - WNL L shoulder flexion to 90* then has pain for remaining range  UPPER EXTREMITY MMT:     BUE - WNL  HAND FUNCTION: Grip strength: Right: 63.2 lbs; Left: 76.4 lbs  COORDINATION: 9 Hole Peg test: Right: 45 sec; Left: 42 sec - required cues for sequencing  SENSATION: WFL  EDEMA: none reported or observed  MUSCLE TONE: WNL  COGNITION: Overall cognitive status: Impaired  VISION: Subjective report: Has difficulty processing what he is seeing Baseline vision: Wears glasses for reading only   VISION ASSESSMENT: Impaired Unable to read text on badge buddy; unable to read clock on wall, but able to read titles of posters on the wall.   PERCEPTION: Impaired: Spatial orientation: better with demonstration and repetition  PRAXIS: Impaired: Initiation and Motor planning  OBSERVATIONS: Pt appears well-kept. No AD, no LOB with ambulation.   TODAY'S TREATMENT:                                                                                                                              OT reviewed environmental scanning strategies and activities to promote environmental scanning as noted in pt instructions. Pt required mod cueing for proper recall.  Pt completed 24 piece puzzle for visual scanning and use of RUE.  OT educated pt on use of iPhone light to help increase light on an as needed basis.    PATIENT EDUCATION: Education details: environmental scanning; Corporate treasurer use Person educated: Patient and Spouse Education method: Explanation, Demonstration, and Verbal  cues Education comprehension: verbalized understanding, returned demonstration, verbal cues required, and needs further education  HOME EXERCISE PROGRAM: 08/07/22: memory strategies, cane HEP  GOALS:  SHORT TERM GOALS: Target date: 08/31/2022    Patient will demonstrate simple UE ROM HEP with 25% verbal cues or less for proper execution. Baseline: Goal status: IN PROGRESS  2.  Patient's caregiver will independently recall at least 2 compensatory strategies for visual impairment without cueing.  Baseline:  Goal status: INITIAL  3.  Patient's caregiver will verbalize understanding of activities to promote visual scanning.  Baseline:  Goal status: INITIAL  LONG TERM GOALS: Target date: 09/14/2022    Patient will demonstrate at least 68.2 lbs R grip strength as needed to open jars and other containers. Baseline: 63.2 lbs Goal status: INITIAL  2.  Pt will score 7/10 on PSFS for same activities (assessed on 08/20/22)  Baseline: 5.3/10 Goal status: INITIAL  3.  Patient will complete nine-hole peg with use of each hand in 35 seconds or less. Baseline: Right: 45 sec; Left: 42 sec  Goal status: INITIAL  4.  Pt will complete UB dressing mod I with use of adaptive strategies such as sequencing chart as needed.  Baseline: min A to thread LUE Goal status: INITIAL   ASSESSMENT:  CLINICAL IMPRESSION: Patient requires multimodal cueing for puzzle completion demonstrating significant impairment with motor planning, insight, and attention. Will continue therapy efforts as pt will require repeat education due to cognition.   PERFORMANCE DEFICITS: in functional skills including ADLs, IADLs, coordination, ROM, strength, pain, Fine motor control, Gross motor control, mobility, vision, and UE functional use, cognitive skills including attention, energy/drive, memory, orientation, perception, problem solving, sequencing, and understand.  IMPAIRMENTS: are limiting patient from ADLs, IADLs, rest  and sleep, work, leisure, and social participation.   CO-MORBIDITIES: may have co-morbidities  that affects occupational performance. Patient will benefit from skilled OT to address above impairments and improve overall function.  MODIFICATION OR ASSISTANCE TO COMPLETE EVALUATION: Min-Moderate modification of tasks or assist with assess necessary to complete an evaluation.  OT OCCUPATIONAL PROFILE AND HISTORY: Problem focused assessment: Including review of records relating to presenting problem.  CLINICAL DECISION MAKING: LOW - limited treatment options, no task modification necessary  REHAB POTENTIAL: Good for goals stated  PLAN:  OT FREQUENCY: 2x/week  OT DURATION: 6 weeks  PLANNED INTERVENTIONS: self care/ADL training, therapeutic exercise, therapeutic activity, manual therapy, passive range of motion, functional mobility training, ultrasound, moist heat, patient/family education, cognitive remediation/compensation, coping strategies training, DME and/or AE instructions, and Re-evaluation  RECOMMENDED OTHER SERVICES: ST and PT referrals to establish multi-disciplinary care  CONSULTED AND AGREED WITH PLAN OF CARE: Patient and family member/caregiver  PLAN FOR NEXT SESSION: review visual scanning, memory strategies, ADLS; extend?   Delana Meyer, OT 09/05/2022, 1:57 PM

## 2022-09-11 ENCOUNTER — Ambulatory Visit: Admitting: Occupational Therapy

## 2022-09-11 DIAGNOSIS — R29818 Other symptoms and signs involving the nervous system: Secondary | ICD-10-CM | POA: Diagnosis not present

## 2022-09-11 DIAGNOSIS — R278 Other lack of coordination: Secondary | ICD-10-CM

## 2022-09-11 DIAGNOSIS — R41842 Visuospatial deficit: Secondary | ICD-10-CM

## 2022-09-11 DIAGNOSIS — M6281 Muscle weakness (generalized): Secondary | ICD-10-CM

## 2022-09-11 DIAGNOSIS — R41840 Attention and concentration deficit: Secondary | ICD-10-CM

## 2022-09-11 NOTE — Therapy (Signed)
OUTPATIENT OCCUPATIONAL THERAPY NEURO TREATMENT  Patient Name: Marvin Spencer MRN: [id number] DOB:[birth year], 57 y.o., male Today's Date: 09/11/2022  PCP: Donita Brooks, MD  REFERRING PROVIDER: Arman Bogus, Eber Jones, MD  END OF SESSION:  OT End of Session - 09/11/22 1110     Visit Number 7    Number of Visits 15    Date for OT Re-Evaluation 11/09/22    Authorization Type [employer]    Authorization - Visit Number 15    OT Start Time 1109    OT Stop Time 1147    OT Time Calculation (min) 38 min    Activity Tolerance Patient tolerated treatment well    Behavior During Therapy WFL for tasks assessed/performed             Past Medical History:  Diagnosis Date   Diabetes mellitus without complication (HCC)    Hyperlipidemia    Hypertriglyceridemia    Peyronie disease    Past Surgical History:  Procedure Laterality Date   CIRCUMCISION     age 40   COLONOSCOPY     POLYPECTOMY     Patient Active Problem List   Diagnosis Date Noted   Vitamin D deficiency 03/20/2021   Type 2 diabetes mellitus with hyperglycemia, with long-term current use of insulin (HCC) 06/27/2019   Hyperlipidemia associated with type 2 diabetes mellitus (HCC) 06/27/2019   Peyronie disease    Hypertriglyceridemia    Diabetes mellitus without complication (HCC)     ONSET DATE: Date of referral - 06/07/2022  REFERRING DIAG: G30.8 (ICD-10-CM) - Other Alzheimer's disease  THERAPY DIAG:  Other lack of coordination  Other symptoms and signs involving the nervous system  Attention and concentration deficit  Visuospatial deficit  Muscle weakness (generalized)  Rationale for Evaluation and Treatment: Rehabilitation  SUBJECTIVE:   SUBJECTIVE STATEMENT: Difficulty staying in the lines I.e. signing his name.   Pt accompanied by: significant other and family member - [name]  PERTINENT HISTORY: Pt referred to OP OT secondary to complications from Alzheimer's from [place].   PCP note  states,"This 57 year old MALE patient is here for a followup coordination seems off with hand eye like picking up fork. Short term memory worsening. Concerns depth perception. Increased anxiety worsening depression. Making finacial mistake hasn't paid mortgage 3 months was paying tution instead. Working daily daughter driving. 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 He has seen neurology here at the [facility] and is willing to see neuropsychology. He has been diagnosed with dementia   no medications have been started. He declined lumbar puncture due to concerns of being paralyzed after the lumbar puncture  Issues with vision started about 1 year ago follow by short term memory loss.  States he no longer uses stove or oven and has confusion on using microwave.  His mood has declined due to his awareness of his progressive vision and memory  issues. "  PRECAUTIONS: Fall  WEIGHT BEARING RESTRICTIONS: No  PAIN:  Are you having pain? No; some B knee pain  FALLS: Has patient fallen in last 6 months? 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   PATIENT GOALS: To be as independent as possible.   OBJECTIVE:   HAND DOMINANCE: Right  ADLs: Overall ADLs: distance supervision  UB Dressing: min A to get L arm through; difficulty with sports coat for church  IADLs:  Light housekeeping: Trash, cleaning, change batteries in doors for alarm, push mow yard Meal Prep: has a culinary degree, make key lime cake, ground [place]  burgers Community mobility: dependently Medication management: supervision to min A Handwriting: Increased time and unable to write on the line for a signature  MOBILITY STATUS: Independent and lacks confidence with stairs; overall wife reports he  walks "gingerly"  ACTIVITY TOLERANCE: Activity tolerance: good  FUNCTIONAL OUTCOME MEASURES: 08/20/22: PSFS = 5.3/10  UPPER EXTREMITY ROM:     BUE - WNL L shoulder flexion to 90* then has pain for remaining range  UPPER EXTREMITY MMT:     BUE - WNL  HAND FUNCTION: Grip strength: Right: 63.2 lbs; Left: 76.4 lbs  COORDINATION: 9 Hole Peg test: Right: 45 sec; Left: 42 sec - required cues for sequencing  SENSATION: WFL  EDEMA: none reported or observed  MUSCLE TONE: WNL  COGNITION: Overall cognitive status: Impaired  VISION: Subjective report: Has difficulty processing what he is seeing Baseline vision: Wears glasses for reading only   VISION ASSESSMENT: Impaired Unable to read text on badge buddy; unable to read clock on wall, but able to read titles of posters on the wall.   PERCEPTION: Impaired: Spatial orientation: better with demonstration and repetition  PRAXIS: Impaired: Initiation and Motor planning  OBSERVATIONS: Pt appears well-kept. No AD, no LOB with ambulation.   TODAY'S TREATMENT:                                                                                                                              OT reviewed environmental scanning strategies and activities to promote environmental scanning as noted in pt instructions. Pt required mod cueing for proper recall and phone flashlight use.  Pt completed 24 piece puzzle for visual scanning and use of RUE.   PATIENT EDUCATION: Education details: environmental scanning; Corporate treasurer use Person educated: Patient and Spouse Education method: Explanation, Demonstration, and Verbal cues Education comprehension: verbalized understanding, returned  demonstration, verbal cues required, and needs further education  HOME EXERCISE PROGRAM: 08/07/22: memory strategies, cane HEP  GOALS:  SHORT TERM GOALS: Target date: 08/31/2022    Patient will demonstrate simple UE ROM HEP with 25% verbal cues or less for proper execution. Baseline: Goal status: IN PROGRESS  2.  Patient's caregiver will independently recall at least 2 compensatory strategies for visual impairment without cueing.  Baseline:  Goal status: INITIAL  3.  Patient's caregiver will verbalize understanding of activities to promote visual scanning.  Baseline:  Goal status: INITIAL   LONG TERM GOALS: Target date: 11/09/2022    Patient will demonstrate  at least 68.2 lbs R grip strength as needed to open jars and other containers. Baseline: 63.2 lbs Goal status: INITIAL  2.  Pt will score 7/10 on PSFS for same activities (assessed on 08/20/22)  Baseline: 5.3/10 Goal status: INITIAL  3.  Patient will complete nine-hole peg with use of each hand in 35 seconds or less. Baseline: Right: 45 sec; Left: 42 sec  Goal status: INITIAL  4.  Pt will complete UB dressing mod I with use of adaptive strategies such as sequencing chart as needed.  Baseline: min A to thread LUE Goal status: INITIAL   ASSESSMENT:  CLINICAL IMPRESSION: Patient requires multimodal cueing for puzzle completion demonstrating significant impairment with motor planning, insight, and attention though less cueing today due to repitition. Will continue therapy efforts as pt will require repeat education due to cognition.   PERFORMANCE DEFICITS: in functional skills including ADLs, IADLs, coordination, ROM, strength, pain, Fine motor control, Gross motor control, mobility, vision, and UE functional use, cognitive skills including attention, energy/drive, memory, orientation, perception, problem solving, sequencing, and understand.  IMPAIRMENTS: are limiting patient from ADLs, IADLs, rest and sleep, work,  leisure, and social participation.   CO-MORBIDITIES: may have co-morbidities  that affects occupational performance. Patient will benefit from skilled OT to address above impairments and improve overall function.  MODIFICATION OR ASSISTANCE TO COMPLETE EVALUATION: Min-Moderate modification of tasks or assist with assess necessary to complete an evaluation.  OT OCCUPATIONAL PROFILE AND HISTORY: Problem focused assessment: Including review of records relating to presenting problem.  CLINICAL DECISION MAKING: LOW - limited treatment options, no task modification necessary  REHAB POTENTIAL: Good for goals stated  PLAN:  OT FREQUENCY: 1x/week  OT DURATION: additional 8 weeks  PLANNED INTERVENTIONS: self care/ADL training, therapeutic exercise, therapeutic activity, manual therapy, passive range of motion, functional mobility training, ultrasound, moist heat, patient/family education, cognitive remediation/compensation, coping strategies training, DME and/or AE instructions, and Re-evaluation  RECOMMENDED OTHER SERVICES: ST and PT referrals to establish multi-disciplinary care  CONSULTED AND AGREED WITH PLAN OF CARE: Patient and family member/caregiver  PLAN FOR NEXT SESSION: review visual scanning, memory strategies, ADLS; extend?   Delana Meyer, OT 09/11/2022, 1:54 PM

## 2022-09-13 ENCOUNTER — Ambulatory Visit: Admitting: Occupational Therapy

## 2022-09-13 DIAGNOSIS — R278 Other lack of coordination: Secondary | ICD-10-CM

## 2022-09-13 DIAGNOSIS — M6281 Muscle weakness (generalized): Secondary | ICD-10-CM

## 2022-09-13 DIAGNOSIS — R29818 Other symptoms and signs involving the nervous system: Secondary | ICD-10-CM

## 2022-09-13 DIAGNOSIS — R41842 Visuospatial deficit: Secondary | ICD-10-CM

## 2022-09-13 DIAGNOSIS — R41840 Attention and concentration deficit: Secondary | ICD-10-CM

## 2022-09-13 NOTE — Therapy (Signed)
OUTPATIENT OCCUPATIONAL THERAPY NEURO TREATMENT  Patient Name: Marvin Spencer MRN: [id number] DOB:07/30/1965, 57 y.o., male Today's Date: 09/13/2022  PCP: Donita Brooks, MD  REFERRING PROVIDER: Arman Bogus, Eber Jones, MD  END OF SESSION:  OT End of Session - 09/13/22 1117     Visit Number 8    Number of Visits 15    Date for OT Re-Evaluation 11/09/22    Authorization Type [employer]    OT Start Time 1106    OT Stop Time 1150    OT Time Calculation (min) 44 min    Activity Tolerance Patient tolerated treatment well    Behavior During Therapy WFL for tasks assessed/performed            Past Medical History:  Diagnosis Date   Diabetes mellitus without complication (HCC)    Hyperlipidemia    Hypertriglyceridemia    Peyronie disease    Past Surgical History:  Procedure Laterality Date   CIRCUMCISION     age 35   COLONOSCOPY     POLYPECTOMY     Patient Active Problem List   Diagnosis Date Noted   Vitamin D deficiency 03/20/2021   Type 2 diabetes mellitus with hyperglycemia, with long-term current use of insulin (HCC) 06/27/2019   Hyperlipidemia associated with type 2 diabetes mellitus (HCC) 06/27/2019   Peyronie disease    Hypertriglyceridemia    Diabetes mellitus without complication (HCC)     ONSET DATE: Date of referral - 06/07/2022  REFERRING DIAG: G30.8 (ICD-10-CM) - Other Alzheimer's disease  THERAPY DIAG:  Other lack of coordination  Other symptoms and signs involving the nervous system  Attention and concentration deficit  Visuospatial deficit  Muscle weakness (generalized)  Rationale for Evaluation and Treatment: Rehabilitation  SUBJECTIVE:   SUBJECTIVE STATEMENT: He had a training for the [employer] the other day and he was pulled aside due to concerns observed by his peers and was encouraged to start the process to retire.   Pt accompanied by: significant other - Pt brought by daughter, Marvin Spencer, who had to leave to go to her own appointment.    PERTINENT HISTORY: Pt referred to OP OT secondary to complications from Alzheimer's from [place].   PCP note states,"This 57 year old MALE patient is here for a followup coordination seems off with hand eye like picking up fork. Short term memory worsening. Concerns depth perception. Increased anxiety worsening depression. Making finacial mistake hasn't paid mortgage 3 months was paying tution instead. Working daily daughter driving. 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 He has seen neurology here at the [facility] and is willing to see neuropsychology. He has been diagnosed with dementia   no medications have been started. He declined lumbar puncture due to concerns of being paralyzed after the lumbar puncture  Issues with vision started about 1 year ago follow by short term memory loss.  States he no longer uses stove or oven and has confusion on using microwave.  His mood has declined due to his awareness of his progressive vision and memory  issues. "  PRECAUTIONS: Fall  WEIGHT BEARING RESTRICTIONS: No  PAIN:  Are you having pain? No; some B knee pain  FALLS: Has patient fallen in last 6 months? 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   PATIENT GOALS: To be as independent as possible.   OBJECTIVE:   HAND DOMINANCE: Right  ADLs: Overall ADLs: distance supervision  UB Dressing: min A to get L arm through; difficulty with sports coat for church  IADLs:  Light housekeeping: Trash, cleaning, change batteries in  doors for alarm, push mow yard Meal Prep: has a culinary degree, make key lime cake, ground [place] burgers Community mobility: dependently Medication management: supervision to min A Handwriting: Increased time and unable to write on the line for a signature  MOBILITY STATUS: Independent and lacks confidence with stairs; overall wife reports he  walks "gingerly"  ACTIVITY TOLERANCE: Activity tolerance: good  FUNCTIONAL OUTCOME MEASURES: 08/20/22: PSFS = 5.3/10  UPPER EXTREMITY ROM:     BUE - WNL L shoulder flexion to 90* then has pain for remaining range  UPPER EXTREMITY MMT:     BUE - WNL  HAND FUNCTION: Grip strength: Right: 63.2 lbs; Left: 76.4 lbs  COORDINATION: 9 Hole Peg test: Right: 45 sec; Left: 42 sec - required cues for sequencing  SENSATION: WFL  EDEMA: none reported or observed  MUSCLE TONE: WNL  COGNITION: Overall cognitive status: Impaired  VISION: Subjective report: Has difficulty processing what he is seeing Baseline vision: Wears glasses for reading only   VISION ASSESSMENT: Impaired Unable to read text on badge buddy; unable to read clock on wall, but able to read titles of posters on the wall.   PERCEPTION: Impaired: Spatial orientation: better with demonstration and repetition  PRAXIS: Impaired: Initiation and Motor planning  OBSERVATIONS: Pt appears well-kept. No AD, no LOB with ambulation.   TODAY'S TREATMENT:                                                                                                                              OT reviewed strategies and POC including how to reach out to [place] for ST and PT referrals and extending OT as he needs to schedule additional visits.   Discussed need to carry phone for medical emergencies, additional lighting, and help with memory I.e. a list of codes.   Furthermore, pt was encouraged to write down recipe for [place] burgers and we discussed safety concerns with cooking and need to address  these in future visits.   Pt completed 24 piece puzzle for visual scanning and use of RUE.  PATIENT EDUCATION: Education details: environmental scanning; POC; home safety Person educated: Patient Education method: Explanation, Demonstration, Verbal cues, and Handouts Education comprehension: verbalized understanding, returned demonstration, verbal cues required, and needs further education  HOME EXERCISE PROGRAM: 08/07/22: memory strategies, cane HEP  GOALS:  SHORT TERM GOALS: Target date: 08/31/2022    Patient will demonstrate simple UE ROM  HEP with 25% verbal cues or less for proper execution. Baseline: Goal status: IN PROGRESS  2.  Patient's caregiver will independently recall at least 2 compensatory strategies for visual impairment without cueing.  Baseline:  Goal status: INITIAL  3.  Patient's caregiver will verbalize understanding of activities to promote visual scanning.  Baseline:  Goal status: INITIAL   LONG TERM GOALS: Target date: 11/09/2022    Patient will demonstrate at least 68.2 lbs R grip strength as needed to open jars and other containers. Baseline: 63.2 lbs Goal status: INITIAL  2.  Pt will score 7/10 on PSFS for same activities (assessed on 08/20/22)  Baseline: 5.3/10 Goal status: INITIAL  3.  Patient will complete nine-hole peg with use of each hand in 35 seconds or less. Baseline: Right: 45 sec; Left: 42 sec  Goal status: INITIAL  4.  Pt will complete UB dressing mod I with use of adaptive strategies such as sequencing chart as needed.  Baseline: min A to thread LUE Goal status: INITIAL   ASSESSMENT:  CLINICAL IMPRESSION: Patient demonstrates better response to cueing this date. Good insight into safety concerns though lacks understanding of strategies needed to allow participation in desired occupations.   PERFORMANCE DEFICITS: in functional skills including ADLs, IADLs, coordination, ROM, strength, pain, Fine motor control, Gross motor  control, mobility, vision, and UE functional use, cognitive skills including attention, energy/drive, memory, orientation, perception, problem solving, sequencing, and understand.  IMPAIRMENTS: are limiting patient from ADLs, IADLs, rest and sleep, work, leisure, and social participation.   CO-MORBIDITIES: may have co-morbidities  that affects occupational performance. Patient will benefit from skilled OT to address above impairments and improve overall function.  MODIFICATION OR ASSISTANCE TO COMPLETE EVALUATION: Min-Moderate modification of tasks or assist with assess necessary to complete an evaluation.  OT OCCUPATIONAL PROFILE AND HISTORY: Problem focused assessment: Including review of records relating to presenting problem.  CLINICAL DECISION MAKING: LOW - limited treatment options, no task modification necessary  REHAB POTENTIAL: Good for goals stated  PLAN:  OT FREQUENCY: 1x/week  OT DURATION: additional 8 weeks  PLANNED INTERVENTIONS: self care/ADL training, therapeutic exercise, therapeutic activity, manual therapy, passive range of motion, functional mobility training, ultrasound, moist heat, patient/family education, cognitive remediation/compensation, coping strategies training, DME and/or AE instructions, and Re-evaluation  RECOMMENDED OTHER SERVICES: ST and PT referrals to establish multi-disciplinary care  CONSULTED AND AGREED WITH PLAN OF CARE: Patient and family member/caregiver  PLAN FOR NEXT SESSION: strategies for safe COOKING AT STOVE; review visual scanning, memory strategies, ADLS;  Delana Meyer, OT 5/62/1308, 1:00 PM

## 2022-09-14 ENCOUNTER — Other Ambulatory Visit: Payer: Self-pay | Admitting: Family Medicine

## 2022-09-14 DIAGNOSIS — R4189 Other symptoms and signs involving cognitive functions and awareness: Secondary | ICD-10-CM

## 2022-09-24 NOTE — Therapy (Deleted)
OUTPATIENT SPEECH LANGUAGE PATHOLOGY EVALUATION   Patient Name: Marvin Spencer MRN: [id number] DOB:1965-05-10, 57 y.o., male Today's Date: 09/24/2022  PCP: Donita Brooks, MD REFERRING PROVIDER: Donita Brooks, MD  END OF SESSION:   Past Medical History:  Diagnosis Date   Diabetes mellitus without complication (HCC)    Hyperlipidemia    Hypertriglyceridemia    Peyronie disease    Past Surgical History:  Procedure Laterality Date   CIRCUMCISION     age 46   COLONOSCOPY     POLYPECTOMY     Patient Active Problem List   Diagnosis Date Noted   Vitamin D deficiency 03/20/2021   Type 2 diabetes mellitus with hyperglycemia, with long-term current use of insulin (HCC) 06/27/2019   Hyperlipidemia associated with type 2 diabetes mellitus (HCC) 06/27/2019   Peyronie disease    Hypertriglyceridemia    Diabetes mellitus without complication (HCC)     ONSET DATE: referral 09/14/22   REFERRING DIAG: cognitive decline   THERAPY DIAG:  No diagnosis found.  Rationale for Evaluation and Treatment: Rehabilitation  SUBJECTIVE:   SUBJECTIVE STATEMENT: *** Pt accompanied by: {accompnied:27141}  PERTINENT HISTORY: PCP note states,"This 57 year old MALE patient is here for a followup coordination seems off with hand eye like picking up fork. Short term memory worsening. Concerns depth perception. Increased anxiety worsening depression. Making finacial mistake hasn't paid mortgage 3 months was paying tution instead. Working daily daughter driving. 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 He has seen neurology here at the [facility] and is willing to see neuropsychology. He has been diagnosed with dementia   no medications have been started. He declined lumbar puncture due to concerns of being paralyzed after the lumbar puncture  Issues with vision started about 1 year ago follow by short term memory  loss.  States he no longer uses stove or oven and has confusion on using microwave.  His mood has declined due to his awareness of his progressive vision and memory  issues. "  PAIN:  Are you having pain? {OPRCPAIN:27236}  FALLS: Has patient fallen in last 6 months?  See PT evaluation for details  LIVING ENVIRONMENT: Lives with: lives with their family Lives in: House/apartment  PLOF:  Level of assistance: Needed assistance with IADLS  PATIENT GOALS: maintain independence   OBJECTIVE:   COGNITION: Overall cognitive status: {cognition:24006} Areas of impairment:  {cognitiveimpairmentslp:27409} Functional deficits: ***  COGNITIVE COMMUNICATION: Following directions: {commands:24018}  Auditory comprehension: {WFL-Impaired:25365} Verbal expression: {WFL-Impaired:25365} Functional communication: {WFL-Impaired:25365}  ORAL MOTOR EXAMINATION: Overall status: {OMESLP2:27645} Comments: ***  STANDARDIZED ASSESSMENTS: {SLPstandardizedassessment:27092}  PATIENT REPORTED OUTCOME MEASURES (PROM): {SLPPROM:27095}   TODAY'S TREATMENT:  DATE: ***   PATIENT EDUCATION: Education details: *** Person educated: {Person educated:25204} Education method: {Education Method:25205} Education comprehension: {Education Comprehension:25206}   GOALS: Goals reviewed with patient? {yes/no:20286}  SHORT TERM GOALS: Target date: ***  *** Baseline: Goal status: INITIAL  2.  *** Baseline:  Goal status: INITIAL  3.  *** Baseline:  Goal status: INITIAL  4.  *** Baseline:  Goal status: INITIAL  5.  *** Baseline:  Goal status: INITIAL  6.  *** Baseline:  Goal status: INITIAL  LONG TERM GOALS: Target date: ***  *** Baseline:  Goal status: INITIAL  2.  *** Baseline:  Goal status: INITIAL  3.  *** Baseline:  Goal status:  INITIAL  4.  *** Baseline:  Goal status: INITIAL  5.  *** Baseline:  Goal status: INITIAL  6.  *** Baseline:  Goal status: INITIAL  ASSESSMENT:  CLINICAL IMPRESSION: Patient is a *** y.o. *** who was seen today for ***.   OBJECTIVE IMPAIRMENTS: include {SLPOBJIMP:27107}. These impairments are limiting patient from {SLPLIMIT:27108}. Factors affecting potential to achieve goals and functional outcome are {SLP factors:25450}.. Patient will benefit from skilled SLP services to address above impairments and improve overall function.  REHAB POTENTIAL: {rehabpotential:25112}  PLAN:  SLP FREQUENCY: {rehab frequency:25116}  SLP DURATION: {rehab duration:25117}  PLANNED INTERVENTIONS: {SLP treatment/interventions:25449}    Maia Breslow, CCC-SLP 09/24/2022, 8:04 PM

## 2022-09-25 ENCOUNTER — Ambulatory Visit: Attending: Internal Medicine | Admitting: Speech Pathology

## 2022-09-25 ENCOUNTER — Encounter: Payer: Self-pay | Admitting: Family Medicine

## 2022-10-08 ENCOUNTER — Encounter: Payer: Self-pay | Admitting: Occupational Therapy

## 2022-10-08 NOTE — Therapy (Signed)
OT called and spoke with pt's wife with regards to if pt wanted to proceed with OT services. Pt also has referrals for ST and PT evals as well but has not scheduled them. Wife stated she would speak with the pt and call back. Clinic number provided.

## 2023-02-14 ENCOUNTER — Other Ambulatory Visit: Payer: Self-pay | Admitting: Family Medicine

## 2023-02-14 DIAGNOSIS — M25511 Pain in right shoulder: Secondary | ICD-10-CM

## 2023-05-11 ENCOUNTER — Other Ambulatory Visit: Payer: Self-pay | Admitting: Family Medicine

## 2023-05-13 NOTE — Telephone Encounter (Signed)
 Requested medications are due for refill today.  yes  Requested medications are on the active medications list.  yes  Last refill. 10/02/2021 #30 3 rf  Future visit scheduled.   no  Notes to clinic.  Pt last seen 11/20/2021    Requested Prescriptions  Pending Prescriptions Disp Refills   pantoprazole (PROTONIX) 40 MG tablet [Pharmacy Med Name: PANTOPRAZOLE 40MG  TABLETS] 30 tablet 3    Sig: TAKE 1 TABLET(40 MG) BY MOUTH DAILY     Gastroenterology: Proton Pump Inhibitors Failed - 05/13/2023  2:24 PM      Failed - Valid encounter within last 12 months    Recent Outpatient Visits           1 year ago Viral upper respiratory tract infection   [facility] Donita Brooks, MD   3 years ago Diabetes mellitus type 2, insulin dependent (HCC)   [facility] Donita Brooks, MD   3 years ago Uncontrolled maturity onset diabetes mellitus in young (MODY) type 2 with hyperosmolarity (HCC)   [facility] Donita Brooks, MD   5 years ago Diabetes mellitus type II, non insulin dependent (HCC)   [facility] Donita Brooks, MD   6 years ago Diabetes mellitus type II, non insulin dependent (HCC)   [facility] Pickard, Priscille Heidelberg, MD

## 2023-06-10 ENCOUNTER — Encounter: Payer: Self-pay | Admitting: Family Medicine

## 2023-06-10 ENCOUNTER — Telehealth: Admitting: Family Medicine

## 2023-06-10 DIAGNOSIS — U071 COVID-19: Secondary | ICD-10-CM | POA: Diagnosis not present

## 2023-06-10 DIAGNOSIS — Z794 Long term (current) use of insulin: Secondary | ICD-10-CM

## 2023-06-10 DIAGNOSIS — E1165 Type 2 diabetes mellitus with hyperglycemia: Secondary | ICD-10-CM | POA: Diagnosis not present

## 2023-06-10 DIAGNOSIS — J302 Other seasonal allergic rhinitis: Secondary | ICD-10-CM

## 2023-06-10 MED ORDER — ALBUTEROL SULFATE HFA 108 (90 BASE) MCG/ACT IN AERS: 1.0000 | INHALATION_SPRAY | Freq: Four times a day (QID) | RESPIRATORY_TRACT | 1 refills | Status: AC | PRN

## 2023-06-10 MED ORDER — BENZONATATE 100 MG PO CAPS: 100.0000 mg | ORAL_CAPSULE | Freq: Three times a day (TID) | ORAL | 1 refills | Status: DC | PRN

## 2023-06-10 MED ORDER — LEVOCETIRIZINE DIHYDROCHLORIDE 5 MG PO TABS: 5.0000 mg | ORAL_TABLET | Freq: Every evening | ORAL | 1 refills | Status: AC

## 2023-06-10 MED ORDER — HYDROCODONE BIT-HOMATROP MBR 5-1.5 MG/5ML PO SOLN: 5.0000 mL | Freq: Three times a day (TID) | ORAL | 0 refills | Status: DC | PRN

## 2023-06-10 NOTE — Progress Notes (Signed)
 As nee  Virtual Visit via Video Note  Assessment and Plan: COVID-19 virus infection  Type 2 diabetes mellitus with hyperglycemia, with long-term current use of insulin  (HCC)  Seasonal allergies  Follow Up Instructions: As needed or if not improving.  No follow-ups on file.  Needs OOW note for Monday - Wednesday, RTW this Thursday.  I discussed the assessment and treatment plan with the patient. The patient was provided an opportunity to ask questions, and all were answered. The patient agreed with the plan and demonstrated an understanding of the instructions.   The patient was advised to call back or seek an in-person evaluation if the symptoms worsen or if the condition fails to improve as anticipated.  The above assessment and management plan was discussed with the patient. The patient verbalized understanding of and has agreed to the management plan.   Amadeo June, MD  I connected with Marvin Spencer on 06/10/23 at 10:00 AM EDT by a video enabled telemedicine application and verified that I am speaking with the correct person using two identifiers.  Patient Location: Home Provider Location: Home Office  I discussed the limitations, risks, security, and privacy concerns of performing an evaluation and management service by video and the availability of in person appointments. I also discussed with the patient that there may be a patient responsible charge related to this service. The patient expressed understanding and agreed to proceed.  Subjective: PCP: Austine Lefort, MD  No chief complaint on file. Covid 19 Infection- Patient was at an event last weekend and was exposed to COVID and tested positive last Wednesday.  Patient started with coughing, sneezing, sore throat and congestion.  Patient had a little bit of wheezing but no shortness of breath.  No fever or chills.  Patient is a non-smoker.  Patient does have a history of uncontrolled diabetes, hypertriglyceridemia  but no asthma or COPD.  Patient has been using over-the-counter meds but not too helpful.  Patient states the worst symptom is the coughing a little bit of wheezing.  Pt would like something for cough during the day and night time. Patient is not getting worse and feels that he may actually be getting slightly better. Wife is concerned about his wheezing and thinks he needs to get an inhaler to have on hand. Nonsmoker. Patient did get 2 COVID vaccines.  Patient does not take anything for allergies but having congestion and sneezing.   ROS: Per HPI  Current Outpatient Medications:    albuterol  (VENTOLIN  HFA) 108 (90 Base) MCG/ACT inhaler, Inhale 1-2 puffs into the lungs every 6 (six) hours as needed for wheezing or shortness of breath., Disp: 1 each, Rfl: 1   benzonatate  (TESSALON  PERLES) 100 MG capsule, Take 1 capsule (100 mg total) by mouth 3 (three) times daily as needed. As needed cough, Disp: 30 capsule, Rfl: 1   HYDROcodone  bit-homatropine (HYCODAN) 5-1.5 MG/5ML syrup, Take 5 mLs by mouth every 8 (eight) hours as needed for cough. Take at night time, Disp: 120 mL, Rfl: 0   levocetirizine (XYZAL ) 5 MG tablet, Take 1 tablet (5 mg total) by mouth every evening., Disp: 90 tablet, Rfl: 1   aspirin EC 81 MG tablet, Take 81 mg by mouth daily., Disp: , Rfl:    chlorpheniramine-HYDROcodone  (TUSSIONEX PENNKINETIC ER) 10-8 MG/5ML, Take 5 mLs by mouth every 12 (twelve) hours as needed for cough. (Patient not taking: Reported on 09/22/2021), Disp: 115 mL, Rfl: 0   Cinnamon 500 MG capsule, See admin instructions. (Patient  not taking: Reported on 09/22/2021), Disp: , Rfl:    COMFORT EZ PEN NEEDLES 31G X 8 MM MISC, , Disp: , Rfl:    glimepiride  (AMARYL ) 4 MG tablet, Take 1 tablet (4 mg total) by mouth 2 (two) times daily., Disp: 60 tablet, Rfl: 3   glucose blood (ACCU-CHEK GUIDE) test strip, See admin instructions., Disp: , Rfl:    glucose blood (FREESTYLE LITE) test strip, Use 3 times daily dx e11.9, Disp: , Rfl:     LANTUS SOLOSTAR 100 UNIT/ML Solostar Pen, Inject 45 Units into the skin daily. In the morning, Disp: , Rfl:    meloxicam  (MOBIC ) 15 MG tablet, TAKE 1 TABLET(15 MG) BY MOUTH DAILY, Disp: 30 tablet, Rfl: 3   metFORMIN  (GLUCOPHAGE ) 1000 MG tablet, Take 1 tablet (1,000 mg total) by mouth 2 (two) times daily with a meal. (Patient not taking: Reported on 08/03/2022), Disp: 60 tablet, Rfl: 3   pantoprazole  (PROTONIX ) 40 MG tablet, Take 1 tablet (40 mg total) by mouth daily. (Patient not taking: Reported on 08/03/2022), Disp: 30 tablet, Rfl: 3   pravastatin (PRAVACHOL) 20 MG tablet, Take 20 mg by mouth daily., Disp: , Rfl: 0   sildenafil  (VIAGRA ) 100 MG tablet, Take 0.5-1 tablets (50-100 mg total) by mouth daily as needed for erectile dysfunction. (Patient not taking: Reported on 09/22/2021), Disp: 5 tablet, Rfl: 11  Current Facility-Administered Medications:    0.9 %  sodium chloride  infusion, 500 mL, Intravenous, Once, Nandigam, Kavitha V, MD  Social History   Socioeconomic History   Marital status: Married    Spouse name: Not on file   Number of children: Not on file   Years of education: Not on file   Highest education level: Not on file  Occupational History   Not on file  Tobacco Use   Smoking status: Never   Smokeless tobacco: Never  Vaping Use   Vaping status: Never Used  Substance and Sexual Activity   Alcohol use: Yes   Drug use: Never   Sexual activity: Not on file  Other Topics Concern   Not on file  Social History Narrative   Not on file   Social Drivers of Health   Financial Resource Strain: Not on file  Food Insecurity: Not on file  Transportation Needs: Not on file  Physical Activity: Not on file  Stress: Not on file  Social Connections: Not on file  Intimate Partner Violence: Not on file     Observations/Objective: There were no vitals filed for this visit. Physical Exam Constitutional:      General: He is not in acute distress. HENT:     Head:  Normocephalic and atraumatic.  Pulmonary:     Comments: Patient does not not appear to have increase work of breathing during video visit.  Neurological:     General: No focal deficit present.     Mental Status: He is alert and oriented to person, place, and time.   I connected with  Marvin Spencer on 06/10/23 by a video enabled telemedicine application and verified that I am speaking with the correct person using two identifiers.   I discussed the limitations of evaluation and management by telemedicine. The patient expressed understanding and agreed to proceed.

## 2023-08-06 ENCOUNTER — Ambulatory Visit: Admitting: Family Medicine

## 2023-08-13 ENCOUNTER — Encounter: Payer: Self-pay | Admitting: Family Medicine

## 2023-08-13 ENCOUNTER — Ambulatory Visit (INDEPENDENT_AMBULATORY_CARE_PROVIDER_SITE_OTHER): Admitting: Family Medicine

## 2023-08-13 VITALS — BP 130/88 | HR 74 | Temp 97.5°F | Ht 68.0 in | Wt 167.4 lb

## 2023-08-13 DIAGNOSIS — M7541 Impingement syndrome of right shoulder: Secondary | ICD-10-CM | POA: Diagnosis not present

## 2023-08-13 DIAGNOSIS — R053 Chronic cough: Secondary | ICD-10-CM | POA: Diagnosis not present

## 2023-08-13 MED ORDER — PANTOPRAZOLE SODIUM 40 MG PO TBEC: 40.0000 mg | DELAYED_RELEASE_TABLET | Freq: Every day | ORAL | 3 refills | Status: AC

## 2023-08-13 NOTE — Progress Notes (Signed)
 Subjective:    Patient ID: Marvin Spencer, male    DOB: 11-27-1965, 58 y.o.   MRN: [id number]  HPI   Patient presents today with chronic cough.  I saw the patient initially in August 2023.  At that time I prescribed pantoprazole  for his chronic cough.  Patient has not been seen in follow-up since that time.  His wife states the cough is returned a few months ago.  Is difficult to pin down an actual timeframe.  He denies any chest pain fever or hemoptysis.  I suspect that the pantoprazole  stopped about 2 years ago and then the medication ran out and the cough came back.  He denies any warning signs such as night sweats, fatigue, fever, or weight loss.  He is not on an ACE inhibitor.  He denies any history of allergies or sinus problems.  He has no history of asthma.  He also complains of chronic pain in his right shoulder.  X-rays from a few years ago showed no evidence of arthritis.  Today abduction is limited to 90 degrees.  He has pain with external rotation.  He has pain with empty can testing.  He has pain with Hawking's maneuver. Past Medical History:  Diagnosis Date   Diabetes mellitus without complication (HCC)    Hyperlipidemia    Hypertriglyceridemia    Peyronie disease    Past Surgical History:  Procedure Laterality Date   CIRCUMCISION     age 45   COLONOSCOPY     POLYPECTOMY     Current Outpatient Medications on File Prior to Visit  Medication Sig Dispense Refill   albuterol  (VENTOLIN  HFA) 108 (90 Base) MCG/ACT inhaler Inhale 1-2 puffs into the lungs every 6 (six) hours as needed for wheezing or shortness of breath. 1 each 1   aspirin EC 81 MG tablet Take 81 mg by mouth daily.     benzonatate  (TESSALON  PERLES) 100 MG capsule Take 1 capsule (100 mg total) by mouth 3 (three) times daily as needed. As needed cough 30 capsule 1   chlorpheniramine-HYDROcodone  (TUSSIONEX PENNKINETIC ER) 10-8 MG/5ML Take 5 mLs by mouth every 12 (twelve) hours as needed for cough. (Patient not  taking: Reported on 09/22/2021) 115 mL 0   Cinnamon 500 MG capsule See admin instructions. (Patient not taking: Reported on 09/22/2021)     COMFORT EZ PEN NEEDLES 31G X 8 MM MISC      glimepiride  (AMARYL ) 4 MG tablet Take 1 tablet (4 mg total) by mouth 2 (two) times daily. 60 tablet 3   glucose blood (ACCU-CHEK GUIDE) test strip See admin instructions.     glucose blood (FREESTYLE LITE) test strip Use 3 times daily dx e11.9     HYDROcodone  bit-homatropine (HYCODAN) 5-1.5 MG/5ML syrup Take 5 mLs by mouth every 8 (eight) hours as needed for cough. Take at night time 120 mL 0   LANTUS SOLOSTAR 100 UNIT/ML Solostar Pen Inject 45 Units into the skin daily. In the morning     levocetirizine (XYZAL ) 5 MG tablet Take 1 tablet (5 mg total) by mouth every evening. 90 tablet 1   meloxicam  (MOBIC ) 15 MG tablet TAKE 1 TABLET(15 MG) BY MOUTH DAILY 30 tablet 3   metFORMIN  (GLUCOPHAGE ) 1000 MG tablet Take 1 tablet (1,000 mg total) by mouth 2 (two) times daily with a meal. (Patient not taking: Reported on 08/03/2022) 60 tablet 3   pantoprazole  (PROTONIX ) 40 MG tablet Take 1 tablet (40 mg total) by mouth daily. (Patient not taking:  Reported on 08/03/2022) 30 tablet 3   pravastatin (PRAVACHOL) 20 MG tablet Take 20 mg by mouth daily.  0   sildenafil  (VIAGRA ) 100 MG tablet Take 0.5-1 tablets (50-100 mg total) by mouth daily as needed for erectile dysfunction. (Patient not taking: Reported on 09/22/2021) 5 tablet 11   Current Facility-Administered Medications on File Prior to Visit  Medication Dose Route Frequency Provider Last Rate Last Admin   0.9 %  sodium chloride  infusion  500 mL Intravenous Once Nandigam, Kavitha V, MD       No Known Allergies Social History   Socioeconomic History   Marital status: Married    Spouse name: Not on file   Number of children: Not on file   Years of education: Not on file   Highest education level: Associate degree: occupational, Scientist, product/process development, or vocational program  Occupational  History   Not on file  Tobacco Use   Smoking status: Never   Smokeless tobacco: Never  Vaping Use   Vaping status: Never Used  Substance and Sexual Activity   Alcohol use: Yes   Drug use: Never   Sexual activity: Not on file  Other Topics Concern   Not on file  Social History Narrative   Not on file   Social Drivers of Health   Financial Resource Strain: Medium Risk (08/02/2023)   Overall Financial Resource Strain (CARDIA)    Difficulty of Paying Living Expenses: Somewhat hard  Food Insecurity: Food Insecurity Present (08/02/2023)   Hunger Vital Sign    Worried About Running Out of Food in the Last Year: Sometimes true    Ran Out of Food in the Last Year: Often true  Transportation Needs: Unmet Transportation Needs (08/02/2023)   PRAPARE - Transportation    Lack of Transportation (Medical): Yes    Lack of Transportation (Non-Medical): Yes  Physical Activity: Sufficiently Active (08/02/2023)   Exercise Vital Sign    Days of Exercise per Week: 5 days    Minutes of Exercise per Session: 40 min  Stress: No Stress Concern Present (08/02/2023)   [employer] of Occupational Health - Occupational Stress Questionnaire    Feeling of Stress: Only a little  Social Connections: Socially Integrated (08/02/2023)   Social Connection and Isolation Panel    Frequency of Communication with Friends and Family: Three times a week    Frequency of Social Gatherings with Friends and Family: Never    Attends Religious Services: More than 4 times per year    Active Member of [employer] or Organizations: Yes    Attends Banker Meetings: Not on file    Marital Status: Married  Catering manager Violence: Not on file      Review of Systems  All other systems reviewed and are negative.      Objective:   Physical Exam Vitals reviewed.  Constitutional:      Appearance: Normal appearance. He is normal weight.  Neck:     Vascular: No JVD.   Cardiovascular:     Rate and Rhythm:  Normal rate and regular rhythm.     Heart sounds: Normal heart sounds. No murmur heard. Pulmonary:     Effort: Pulmonary effort is normal. No respiratory distress.     Breath sounds: Normal breath sounds. No wheezing or rales.  Abdominal:     General: Bowel sounds are normal.   Musculoskeletal:        General: Tenderness present.     Right shoulder: Tenderness present. No swelling, deformity, effusion, bony tenderness  or crepitus. Decreased range of motion. Decreased strength.     Left shoulder: No swelling, deformity, effusion, tenderness, bony tenderness or crepitus. Normal range of motion. Normal strength.     Cervical back: Neck supple.   Neurological:     General: No focal deficit present.     Mental Status: He is alert. Mental status is at baseline.           Assessment & Plan:  Impingement syndrome of right shoulder - Plan: Ambulatory referral to Physical Therapy  Chronic cough I believe the chronic cough is due to upper airway cough syndrome primarily due to acid reflux.  Will going to try pantoprazole  40 mg a day and then reassess in 2 weeks.  If cough is not improving, consider other potential causes such as asthma versus allergies.  Consider pulmonary function testing versus pulmonology referral.  Patient also has what to be intentional syndrome although I cannot rule out a tear in the rotator cuff.  I offered the patient a cortisone injection in the shoulder.  Instead he elects to receive physical therapy

## 2023-08-27 ENCOUNTER — Ambulatory Visit: Payer: Self-pay | Attending: Family Medicine

## 2023-08-27 DIAGNOSIS — M25611 Stiffness of right shoulder, not elsewhere classified: Secondary | ICD-10-CM | POA: Insufficient documentation

## 2023-08-27 DIAGNOSIS — G8929 Other chronic pain: Secondary | ICD-10-CM | POA: Insufficient documentation

## 2023-08-27 DIAGNOSIS — M7541 Impingement syndrome of right shoulder: Secondary | ICD-10-CM | POA: Insufficient documentation

## 2023-08-27 DIAGNOSIS — M25511 Pain in right shoulder: Secondary | ICD-10-CM | POA: Insufficient documentation

## 2023-08-27 NOTE — Therapy (Signed)
 OUTPATIENT PHYSICAL THERAPY  EVALUATION   Patient Name: Marvin Spencer MRN: [id number] DOB:05-10-1965, 58 y.o., male Today's Date: 08/27/2023  END OF SESSION:  PT End of Session - 08/27/23 0735     Visit Number 1    Number of Visits 17    Date for PT Re-Evaluation 10/25/23    PT Start Time 0735    PT Stop Time 0833    PT Time Calculation (min) 58 min    Activity Tolerance Patient tolerated treatment well    Behavior During Therapy [facility] for tasks assessed/performed          Past Medical History:  Diagnosis Date   Diabetes mellitus without complication (HCC)    Hyperlipidemia    Hypertriglyceridemia    Peyronie disease    Past Surgical History:  Procedure Laterality Date   CIRCUMCISION     age 66   COLONOSCOPY     POLYPECTOMY     Patient Active Problem List   Diagnosis Date Noted   Seasonal allergies 06/10/2023   COVID-19 virus infection 06/10/2023   Vitamin D deficiency 03/20/2021   Type 2 diabetes mellitus with hyperglycemia, with long-term current use of insulin  (HCC) 06/27/2019   Hyperlipidemia associated with type 2 diabetes mellitus (HCC) 06/27/2019   Peyronie disease    Hypertriglyceridemia    Diabetes mellitus without complication (HCC)     PCP:  Duanne Butler DASEN, MD  REFERRING PROVIDER:  Duanne Butler DASEN, MD  REFERRING DIAG: M75.41 (ICD-10-CM) - Impingement syndrome of right shoulder  THERAPY DIAG:  Chronic right shoulder pain - Plan: PT plan of care cert/re-cert  Stiffness of right shoulder, not elsewhere classified - Plan: PT plan of care cert/re-cert  Rationale for Evaluation and Treatment: Rehabilitation  ONSET DATE: 08/13/2023 (Date PT referral signed. Pain started several months ago per pt)  SUBJECTIVE:                                                                                                                                                                                                         SUBJECTIVE STATEMENT: R posterior  shoulder and upper trap. 6/10 at worst for the past 3 months.   Hand dominance: Right  PERTINENT HISTORY:  R shoulder pain. Gradual onset since several months ago. Pain has worsened since onset.   No latex allergies. No blood pressure problems per pt.   PAIN:  Are you having pain? Yes: NPRS scale: 4/10 Pain location: R posterior shoulder and upper trap area Pain description: tight Aggravating factors: abduction, reaching up, reaching behind a lot, donning  and doffing clothes such as a T-shirt.  Relieving factors: rest  PRECAUTIONS: None  RED FLAGS: Bowel or bladder incontinence: No and Cauda equina syndrome: No     WEIGHT BEARING RESTRICTIONS: No  FALLS:  Has patient fallen in last 6 months? No  LIVING ENVIRONMENT:   OCCUPATION: Not currently working.   PLOF: Independent  PATIENT GOALS: be better able to don clothing such as a T-shirt more comfortably  NEXT MD VISIT: None that patient can recall.   OBJECTIVE:  Note: Objective measures were completed at Evaluation unless otherwise noted.  DIAGNOSTIC FINDINGS:  DG Shoulder Right 11/21/2021  Narrative & Impression  CLINICAL DATA:  Right shoulder pain for 2 months.   EXAM: RIGHT SHOULDER - 2+ VIEW   COMPARISON:  None Available.   FINDINGS: Normal anatomic alignment. No evidence for acute fracture or dislocation. Regional soft tissues are unremarkable. Visualized right hemithorax is unremarkable.   IMPRESSION: No acute osseous abnormality.     Electronically Signed   By: Bard Moats M.D.   On: 11/23/2021 14:22    PATIENT SURVEYS:  UEFS  Extreme difficulty/unable (0), Quite a bit of difficulty (1), Moderate difficulty (2), Little difficulty (3), No difficulty (4) Survey date:  08/27/2023  Any of your usual work, household or school activities 2  2. Your usual hobbies, recreational/sport activities 3   3. Lifting a bag of groceries to waist level 2   4. Lifting a bag of groceries above your head 2  5.  Grooming your hair 3  6. Pushing up on your hands (I.e. from bathtub or chair) 2  7. Preparing food (I.e. peeling/cutting) 1  8. Driving  0  9. Vacuuming, sweeping, or raking 3  10. Dressing  3  11. Doing up buttons 0  12. Using tools/appliances 0  13. Opening doors 3  14. Cleaning  2  15. Tying or lacing shoes 2  16. Sleeping  3  17. Laundering clothes (I.e. washing, ironing, folding) 4  18. Opening a jar 4  19. Throwing a ball 3  20. Carrying a small suitcase with your affected limb.  3  Score total:  45/80    UEFI score: 45/80 (08/27/2023)  COGNITION: Overall cognitive status: some difficulty following directions observed  SENSATION: WFL  POSTURE: B protracted shoulders, slight R lateral shift  PALPATION: No TTP R shoulder.  Muscle tension B upper trap and cervical paraspinals, R pectoralis major   CERVICAL ROM:   Active ROM A/PROM (deg) eval  Flexion full  Extension limited  Right lateral flexion limited  Left lateral flexion limited  Right rotation limited  Left rotation limited   (Blank rows = not tested)  UPPER EXTREMITY ROM:  Active ROM Right eval Left eval  Shoulder flexion 116 (121AAROM, stiff end feel and shoulder pain) 140  Shoulder extension    Shoulder abduction 123 (133 AAROM with stiff end feel and posterior shoulder pain) 137  Shoulder adduction    Shoulder extension    Shoulder internal rotation (functional ROM) R thumb to S1 L thumb to T12  Shoulder external rotation (functional ROM) R 3rd digit to base of skull L 3rd digit to T1 spinous process  Elbow flexion    Elbow extension    Wrist flexion    Wrist extension    Wrist ulnar deviation    Wrist radial deviation    Wrist pronation    Wrist supination     (Blank rows = not tested)  UPPER EXTREMITY MMT:  MMT  Right eval Left eval  Shoulder flexion 4+ 5  Shoulder extension    Shoulder abduction 4+ with R posterior shoulder pain 4+  Shoulder adduction    Shoulder extension     Shoulder internal rotation 5 5  Shoulder external rotation 4 4+  Middle trapezius 4   Lower trapezius 4   Elbow flexion    Elbow extension    Wrist flexion    Wrist extension    Wrist ulnar deviation    Wrist radial deviation    Wrist pronation    Wrist supination    Grip strength     (Blank rows = not tested)   SPECIAL TESTS:  R shoulder: (-) Hawkins-Kennedy  (-) empty can  (+) anterior apprehension test   FUNCTIONAL TESTS:    TREATMENT DATE: 08/27/2023                                                                                                                               Manual therapy   Supine inferior glide grade 3 R humeral head with arm in about 90 degrees abduction   Supine STM to R teres major muscle with shoulder in flexion to end range  128 R shoulder flexion AROM after manual therapy    PATIENT EDUCATION:  Education details: POC Person educated: Patient and Spouse Education method: Explanation Education comprehension: verbalized understanding  HOME EXERCISE PROGRAM:   ASSESSMENT:  CLINICAL IMPRESSION: Patient is a 58 y.o. male who was seen today for physical therapy evaluation and treatment for R shoulder pain. He also presents with B protracted shoulder posture, decreased R glenohumeral joint inferior mobility, R teres major, upper trap muscle tension, R middle and lower trap weakness, positive special test suggesting posterior shoulder joint impingement, and difficulty performing tasks which involve reaching up, to the side, behind his back as well as donning and doffing clothes secondary to pain. Pt will benefit from skilled physical therapy services to address the aforementioned deficits.    OBJECTIVE IMPAIRMENTS: decreased ROM, decreased strength, hypomobility, improper body mechanics, postural dysfunction, and pain.   ACTIVITY LIMITATIONS: lifting, bathing, toileting, dressing, reach over head, and hygiene/grooming  PARTICIPATION  LIMITATIONS:   PERSONAL FACTORS: Time since onset of injury/illness/exacerbation and 1 comorbidity: Diabetes mellitus are also affecting patient's functional outcome.   REHAB POTENTIAL: Fair    CLINICAL DECISION MAKING: Evolving/moderate complexity Pain is worsening per pt  EVALUATION COMPLEXITY: Low   GOALS: Goals reviewed with patient? Yes  SHORT TERM GOALS: Target date: 09/06/2023  Pt will be independent with his initial HEP to decrease pain, improve ROM, strength, function, and ability to reach and don and doff clothes more comfortably.  Baseline: Pt has not yet started his initial HEP (08/27/2023) Goal status: INITIAL     LONG TERM GOALS: Target date: 10/25/2023  Pt will have a decrease in R shoulder pain to 2/10 or less at worst to promote ability to reach and don and doff clothes  more comfortably.  Baseline: 6/10 R shoulder pain at worst for the past 3 months (08/27/2023) Goal status: INITIAL  2.  Pt will improve his UEFI score by at least 10 points as a demonstration of improved function.  Baseline: UEFI score: 45/80 (08/27/2023) Goal status: INITIAL  3.  Pt will improve his R shoulder ER and lower trap strength by at least 1/2 MMT grade to promote ability to reach, as well as don and doff clothes more comfortably for his shoulder.  Baseline:  MMT Right eval  Shoulder external rotation 4  Middle trapezius 4  Lower trapezius 4   Goal status: INITIAL  4.  Pt will improve his R shoulder flexion and abduction AROM by at least 20 degrees to promote ability to reach up with less difficulty.  Baseline:  Active ROM Right eval  Shoulder flexion 116 (121AAROM, stiff end feel and shoulder pain)  Shoulder abduction 123 (133 AAROM with stiff end feel and posterior shoulder pain)   Goal status: INITIAL  5.  Pt will improve his R shoulder functional IR to R thumb to T12 and functional ER to R 3rd digit to scapular spine to promote ability to reach behind his back, perform self  grooming, as well as don and doff clothes more comfortably.   Baseline:  Active ROM Right eval  Shoulder internal rotation (functional ROM) R thumb to S1  Shoulder external rotation (functional ROM) R 3rd digit to base of skull   Goal status: INITIAL    PLAN:  PT FREQUENCY: 1-2x/week  PT DURATION: 8 weeks  PLANNED INTERVENTIONS: 97110-Therapeutic exercises, 97530- Therapeutic activity, 97112- Neuromuscular re-education, 97535- Self Care, [id number]- Manual therapy, [id number]- Electrical stimulation (unattended), [phone]- Ionotophoresis 4mg /ml Dexamethasone, Patient/Family education, Joint mobilization, Joint manipulation, Spinal manipulation, and Spinal mobilization  PLAN FOR NEXT SESSION: posture, pectoralis and teres major muscle stretching, lower trap, middle trap and ER strengthening, glenohumeral mechanics, joint mobilization, manual techniques, modalities PRN   Caliber Landess, PT, DPT 08/27/2023, 10:12 AM

## 2023-08-29 ENCOUNTER — Telehealth: Payer: Self-pay

## 2023-08-29 ENCOUNTER — Ambulatory Visit: Payer: Self-pay

## 2023-08-29 NOTE — Telephone Encounter (Signed)
 No show. Called patient and left a message pertaining to appointment and a reminder for the next follow up session. Return phone call requested. Phone number [phone]) provided.

## 2023-09-03 ENCOUNTER — Telehealth: Payer: Self-pay

## 2023-09-03 ENCOUNTER — Ambulatory Visit: Payer: Self-pay

## 2023-09-03 NOTE — Telephone Encounter (Signed)
 No show. Called patient who said that he will not be able to make his appointments because his insurance has changed (he is currently retired).

## 2023-09-23 ENCOUNTER — Encounter

## 2023-09-26 ENCOUNTER — Encounter

## 2023-09-30 ENCOUNTER — Encounter

## 2023-10-02 ENCOUNTER — Encounter

## 2023-10-07 ENCOUNTER — Encounter

## 2023-10-09 ENCOUNTER — Encounter

## 2023-10-14 ENCOUNTER — Encounter

## 2023-10-16 ENCOUNTER — Encounter
